# Patient Record
Sex: Male | Born: 1935 | Race: White | Hispanic: No | Marital: Single | Smoking: Former smoker
Health system: Southern US, Community
[De-identification: ages and names within clinical notes are randomized; demographics above are authoritative.]

## PROBLEM LIST (undated history)

## (undated) DIAGNOSIS — I4891 Unspecified atrial fibrillation: Secondary | ICD-10-CM

## (undated) DIAGNOSIS — I219 Acute myocardial infarction, unspecified: Secondary | ICD-10-CM

---

## 2010-11-26 DIAGNOSIS — Z8249 Family history of ischemic heart disease and other diseases of the circulatory system: Secondary | ICD-10-CM | POA: Insufficient documentation

## 2012-05-05 DIAGNOSIS — I200 Unstable angina: Secondary | ICD-10-CM | POA: Insufficient documentation

## 2012-05-05 DIAGNOSIS — I2510 Atherosclerotic heart disease of native coronary artery without angina pectoris: Secondary | ICD-10-CM | POA: Insufficient documentation

## 2012-06-14 DIAGNOSIS — I2589 Other forms of chronic ischemic heart disease: Secondary | ICD-10-CM | POA: Insufficient documentation

## 2012-06-14 DIAGNOSIS — R0989 Other specified symptoms and signs involving the circulatory and respiratory systems: Secondary | ICD-10-CM | POA: Insufficient documentation

## 2012-06-14 DIAGNOSIS — I4891 Unspecified atrial fibrillation: Secondary | ICD-10-CM | POA: Insufficient documentation

## 2012-06-14 DIAGNOSIS — R0609 Other forms of dyspnea: Secondary | ICD-10-CM | POA: Insufficient documentation

## 2012-12-03 DIAGNOSIS — G459 Transient cerebral ischemic attack, unspecified: Secondary | ICD-10-CM | POA: Insufficient documentation

## 2012-12-03 DIAGNOSIS — Z955 Presence of coronary angioplasty implant and graft: Secondary | ICD-10-CM | POA: Insufficient documentation

## 2017-10-19 ENCOUNTER — Inpatient Hospital Stay
Admission: EM | Admit: 2017-10-19 | Discharge: 2017-10-25 | DRG: 872 | Disposition: A | Discharge status: Skilled Nursing Facility | Payer: Medicare HMO | Attending: Internal Medicine | Admitting: Internal Medicine

## 2017-10-19 ENCOUNTER — Emergency Department: Payer: Medicare HMO

## 2017-10-19 ENCOUNTER — Other Ambulatory Visit: Payer: Self-pay

## 2017-10-19 DIAGNOSIS — R6520 Severe sepsis without septic shock: Secondary | ICD-10-CM | POA: Diagnosis present

## 2017-10-19 DIAGNOSIS — M6282 Rhabdomyolysis: Secondary | ICD-10-CM | POA: Diagnosis present

## 2017-10-19 DIAGNOSIS — I482 Chronic atrial fibrillation: Secondary | ICD-10-CM | POA: Diagnosis present

## 2017-10-19 DIAGNOSIS — I248 Other forms of acute ischemic heart disease: Secondary | ICD-10-CM | POA: Diagnosis present

## 2017-10-19 DIAGNOSIS — J209 Acute bronchitis, unspecified: Secondary | ICD-10-CM | POA: Diagnosis present

## 2017-10-19 DIAGNOSIS — Z9181 History of falling: Secondary | ICD-10-CM

## 2017-10-19 DIAGNOSIS — M79671 Pain in right foot: Secondary | ICD-10-CM | POA: Diagnosis present

## 2017-10-19 DIAGNOSIS — R531 Weakness: Secondary | ICD-10-CM | POA: Diagnosis not present

## 2017-10-19 DIAGNOSIS — K219 Gastro-esophageal reflux disease without esophagitis: Secondary | ICD-10-CM | POA: Diagnosis present

## 2017-10-19 DIAGNOSIS — R0602 Shortness of breath: Secondary | ICD-10-CM

## 2017-10-19 DIAGNOSIS — Z7901 Long term (current) use of anticoagulants: Secondary | ICD-10-CM

## 2017-10-19 DIAGNOSIS — I252 Old myocardial infarction: Secondary | ICD-10-CM

## 2017-10-19 DIAGNOSIS — E876 Hypokalemia: Secondary | ICD-10-CM | POA: Diagnosis not present

## 2017-10-19 DIAGNOSIS — Z8673 Personal history of transient ischemic attack (TIA), and cerebral infarction without residual deficits: Secondary | ICD-10-CM | POA: Diagnosis not present

## 2017-10-19 DIAGNOSIS — M79672 Pain in left foot: Secondary | ICD-10-CM | POA: Diagnosis present

## 2017-10-19 DIAGNOSIS — N400 Enlarged prostate without lower urinary tract symptoms: Secondary | ICD-10-CM | POA: Diagnosis present

## 2017-10-19 DIAGNOSIS — A419 Sepsis, unspecified organism: Secondary | ICD-10-CM | POA: Diagnosis not present

## 2017-10-19 DIAGNOSIS — N390 Urinary tract infection, site not specified: Secondary | ICD-10-CM | POA: Diagnosis present

## 2017-10-19 DIAGNOSIS — D72829 Elevated white blood cell count, unspecified: Secondary | ICD-10-CM

## 2017-10-19 DIAGNOSIS — J849 Interstitial pulmonary disease, unspecified: Secondary | ICD-10-CM | POA: Diagnosis present

## 2017-10-19 DIAGNOSIS — I2510 Atherosclerotic heart disease of native coronary artery without angina pectoris: Secondary | ICD-10-CM | POA: Diagnosis present

## 2017-10-19 DIAGNOSIS — K769 Liver disease, unspecified: Secondary | ICD-10-CM | POA: Diagnosis present

## 2017-10-19 DIAGNOSIS — Z66 Do not resuscitate: Secondary | ICD-10-CM | POA: Diagnosis present

## 2017-10-19 DIAGNOSIS — N179 Acute kidney failure, unspecified: Secondary | ICD-10-CM | POA: Diagnosis present

## 2017-10-19 DIAGNOSIS — I509 Heart failure, unspecified: Secondary | ICD-10-CM

## 2017-10-19 DIAGNOSIS — I10 Essential (primary) hypertension: Secondary | ICD-10-CM | POA: Diagnosis present

## 2017-10-19 DIAGNOSIS — R52 Pain, unspecified: Secondary | ICD-10-CM

## 2017-10-19 HISTORY — DX: Unspecified atrial fibrillation: I48.91

## 2017-10-19 HISTORY — DX: Acute myocardial infarction, unspecified: I21.9

## 2017-10-19 LAB — CBC WITH DIFFERENTIAL/PLATELET
BASOS ABS: 0.1 10*3/uL (ref 0–0.1)
BASOS PCT: 0 %
Eosinophils Absolute: 0 10*3/uL (ref 0–0.7)
Eosinophils Relative: 0 %
HEMATOCRIT: 36.7 % — AB (ref 40.0–52.0)
HEMOGLOBIN: 12.8 g/dL — AB (ref 13.0–18.0)
LYMPHS PCT: 4 %
Lymphs Abs: 0.9 10*3/uL — ABNORMAL LOW (ref 1.0–3.6)
MCH: 33 pg (ref 26.0–34.0)
MCHC: 34.7 g/dL (ref 32.0–36.0)
MCV: 95.1 fL (ref 80.0–100.0)
MONOS PCT: 8 %
Monocytes Absolute: 2 10*3/uL — ABNORMAL HIGH (ref 0.2–1.0)
NEUTROS ABS: 21.5 10*3/uL — AB (ref 1.4–6.5)
NEUTROS PCT: 88 %
Platelets: 173 10*3/uL (ref 150–440)
RBC: 3.86 MIL/uL — ABNORMAL LOW (ref 4.40–5.90)
RDW: 14.5 % (ref 11.5–14.5)
WBC: 24.5 10*3/uL — ABNORMAL HIGH (ref 3.8–10.6)

## 2017-10-19 LAB — URINALYSIS, COMPLETE (UACMP) WITH MICROSCOPIC
BILIRUBIN URINE: NEGATIVE
Glucose, UA: NEGATIVE mg/dL
KETONES UR: NEGATIVE mg/dL
Leukocytes, UA: NEGATIVE
NITRITE: NEGATIVE
PROTEIN: NEGATIVE mg/dL
SQUAMOUS EPITHELIAL / LPF: NONE SEEN (ref 0–5)
Specific Gravity, Urine: 1.009 (ref 1.005–1.030)
pH: 5 (ref 5.0–8.0)

## 2017-10-19 LAB — COMPREHENSIVE METABOLIC PANEL
ALT: 22 U/L (ref 0–44)
AST: 106 U/L — AB (ref 15–41)
Albumin: 3.6 g/dL (ref 3.5–5.0)
Alkaline Phosphatase: 46 U/L (ref 38–126)
Anion gap: 11 (ref 5–15)
BUN: 19 mg/dL (ref 8–23)
CHLORIDE: 102 mmol/L (ref 98–111)
CO2: 25 mmol/L (ref 22–32)
Calcium: 8.9 mg/dL (ref 8.9–10.3)
Creatinine, Ser: 1.56 mg/dL — ABNORMAL HIGH (ref 0.61–1.24)
GFR calc Af Amer: 46 mL/min — ABNORMAL LOW (ref >60)
GFR calc non Af Amer: 40 mL/min — ABNORMAL LOW (ref >60)
Glucose, Bld: 98 mg/dL (ref 70–99)
POTASSIUM: 3.7 mmol/L (ref 3.5–5.1)
SODIUM: 138 mmol/L (ref 135–145)
Total Bilirubin: 1.7 mg/dL — ABNORMAL HIGH (ref 0.3–1.2)
Total Protein: 7.2 g/dL (ref 6.5–8.1)

## 2017-10-19 LAB — PROTIME-INR
INR: 1.41
PROTHROMBIN TIME: 17.1 s — AB (ref 11.4–15.2)

## 2017-10-19 LAB — TSH: TSH: 1.283 u[IU]/mL (ref 0.350–4.500)

## 2017-10-19 LAB — LIPASE, BLOOD: Lipase: 24 U/L (ref 11–51)

## 2017-10-19 LAB — TROPONIN I
TROPONIN I: 0.04 ng/mL — AB (ref <0.03)
TROPONIN I: 0.05 ng/mL — AB (ref <0.03)

## 2017-10-19 LAB — LACTIC ACID, PLASMA
LACTIC ACID, VENOUS: 1.6 mmol/L (ref 0.5–1.9)
LACTIC ACID, VENOUS: 2 mmol/L — AB (ref 0.5–1.9)

## 2017-10-19 LAB — APTT: aPTT: 26 seconds (ref 24–36)

## 2017-10-19 MED ORDER — VANCOMYCIN HCL 10 G IV SOLR
1250.0000 mg | INTRAVENOUS | Status: DC
  Administered 2017-10-19: 1250 mg via INTRAVENOUS
  Filled 2017-10-19 (×2): qty 1250

## 2017-10-19 MED ORDER — SODIUM CHLORIDE 0.9 % IV SOLN
2.0000 g | Freq: Two times a day (BID) | INTRAVENOUS | Status: DC
  Administered 2017-10-20 – 2017-10-22 (×4): 2 g via INTRAVENOUS
  Filled 2017-10-19 (×6): qty 2

## 2017-10-19 MED ORDER — TAMSULOSIN HCL 0.4 MG PO CAPS
0.4000 mg | ORAL_CAPSULE | Freq: Two times a day (BID) | ORAL | Status: DC
  Administered 2017-10-19 – 2017-10-25 (×12): 0.4 mg via ORAL
  Filled 2017-10-19 (×13): qty 1

## 2017-10-19 MED ORDER — ONDANSETRON HCL 4 MG/2ML IJ SOLN: 4.0000 mg | Freq: Four times a day (QID) | INTRAMUSCULAR | Status: DC | PRN

## 2017-10-19 MED ORDER — WARFARIN 1.25 MG HALF TABLET: 1.2500 mg | ORAL_TABLET | Freq: Every day | ORAL | Status: DC

## 2017-10-19 MED ORDER — ONDANSETRON HCL 4 MG PO TABS: 4.0000 mg | ORAL_TABLET | Freq: Four times a day (QID) | ORAL | Status: DC | PRN

## 2017-10-19 MED ORDER — WARFARIN - PHARMACIST DOSING INPATIENT
Freq: Every day | Status: DC
  Administered 2017-10-21: 17:00:00
  Filled 2017-10-19: qty 1

## 2017-10-19 MED ORDER — PANTOPRAZOLE SODIUM 40 MG PO TBEC
40.0000 mg | DELAYED_RELEASE_TABLET | Freq: Every day | ORAL | Status: DC
  Administered 2017-10-19 – 2017-10-25 (×7): 40 mg via ORAL
  Filled 2017-10-19 (×7): qty 1

## 2017-10-19 MED ORDER — WARFARIN SODIUM 2 MG PO TABS
2.0000 mg | ORAL_TABLET | Freq: Once | ORAL | Status: AC
  Administered 2017-10-19: 23:00:00 2 mg via ORAL
  Filled 2017-10-19: qty 1

## 2017-10-19 MED ORDER — VANCOMYCIN HCL IN DEXTROSE 1-5 GM/200ML-% IV SOLN
1000.0000 mg | Freq: Once | INTRAVENOUS | Status: AC
  Administered 2017-10-19: 1000 mg via INTRAVENOUS
  Filled 2017-10-19: qty 200

## 2017-10-19 MED ORDER — ISOSORBIDE MONONITRATE ER 30 MG PO TB24
30.0000 mg | ORAL_TABLET | Freq: Every day | ORAL | Status: DC
  Administered 2017-10-20 – 2017-10-25 (×6): 30 mg via ORAL
  Filled 2017-10-19 (×6): qty 1

## 2017-10-19 MED ORDER — ENOXAPARIN SODIUM 40 MG/0.4ML ~~LOC~~ SOLN
40.0000 mg | SUBCUTANEOUS | Status: DC
  Administered 2017-10-19 – 2017-10-24 (×6): 40 mg via SUBCUTANEOUS
  Filled 2017-10-19 (×6): qty 0.4

## 2017-10-19 MED ORDER — VERAPAMIL HCL 80 MG PO TABS
80.0000 mg | ORAL_TABLET | Freq: Two times a day (BID) | ORAL | Status: DC
  Administered 2017-10-19 – 2017-10-25 (×12): 80 mg via ORAL
  Filled 2017-10-19 (×13): qty 1

## 2017-10-19 MED ORDER — SODIUM CHLORIDE 0.9 % IV SOLN
2.0000 g | Freq: Once | INTRAVENOUS | Status: AC
  Administered 2017-10-19: 2 g via INTRAVENOUS
  Filled 2017-10-19: qty 2

## 2017-10-19 MED ORDER — NITROGLYCERIN 0.4 MG SL SUBL: 0.4000 mg | SUBLINGUAL_TABLET | SUBLINGUAL | Status: DC | PRN

## 2017-10-19 MED ORDER — SODIUM CHLORIDE 0.9 % IV BOLUS (SEPSIS)
1000.0000 mL | Freq: Once | INTRAVENOUS | Status: AC
  Administered 2017-10-19: 1000 mL via INTRAVENOUS

## 2017-10-19 MED ORDER — ACETAMINOPHEN 325 MG PO TABS
325.0000 mg | ORAL_TABLET | Freq: Four times a day (QID) | ORAL | Status: DC | PRN
  Administered 2017-10-21 (×2): 325 mg via ORAL
  Filled 2017-10-19 (×2): qty 1

## 2017-10-19 MED ORDER — SODIUM CHLORIDE 0.9 % IV SOLN
INTRAVENOUS | Status: DC
  Administered 2017-10-19 – 2017-10-20 (×2): via INTRAVENOUS

## 2017-10-19 MED ORDER — METRONIDAZOLE IN NACL 5-0.79 MG/ML-% IV SOLN
500.0000 mg | Freq: Three times a day (TID) | INTRAVENOUS | Status: DC
  Administered 2017-10-19 – 2017-10-20 (×3): 500 mg via INTRAVENOUS
  Filled 2017-10-19 (×5): qty 100

## 2017-10-19 MED ORDER — DOCUSATE SODIUM 100 MG PO CAPS
100.0000 mg | ORAL_CAPSULE | Freq: Two times a day (BID) | ORAL | Status: DC
  Administered 2017-10-19 – 2017-10-25 (×12): 100 mg via ORAL
  Filled 2017-10-19 (×12): qty 1

## 2017-10-19 MED ORDER — ATORVASTATIN CALCIUM 20 MG PO TABS
40.0000 mg | ORAL_TABLET | Freq: Every day | ORAL | Status: DC
  Administered 2017-10-20 – 2017-10-24 (×4): 40 mg via ORAL
  Filled 2017-10-19 (×5): qty 2

## 2017-10-19 NOTE — H&P (Signed)
[facility] NAME: Jeremiah James    MR#:  [id number]  DATE OF BIRTH:  November 10, 1933  DATE OF ADMISSION:  10/19/2017  PRIMARY CARE PHYSICIAN: [name], MD   REQUESTING/REFERRING PHYSICIAN: [name]  CHIEF COMPLAINT:   Generalized weakness HISTORY OF PRESENT ILLNESS:  Jeremiah James  is a 84 y.o. male with a known history of chronic atrial fibrillation on Coumadin, coronary artery disease status post MI, history of TIA with generalized weakness is presenting to the ED with worsening of weakness.  Today patient is unable to get out of the chair and he fell because he was too weak to walk.  Patient came into the ED.  Creatinine is at 1.52 and WBC count is elevated.  Patient is tachycardic sepsis protocol implemented cultures were done and patient is started on empiric IV antibiotics hospitalist team is called to admit the patient.  PAST MEDICAL HISTORY:   Past Medical History:  Diagnosis Date  . A-fib ([place])   . MI (myocardial infarction) ([place])    x6    PAST SURGICAL HISTOIRY:   Denies any surgeries SOCIAL HISTORY:   Social History   Tobacco Use  . Smoking status: Not on file  Substance Use Topics  . Alcohol use: Not on file    FAMILY HISTORY:  No family history on file.  DRUG ALLERGIES:   Allergies  Allergen Reactions  . Morphine Rash    unknown  . Hydrocodone-Acetaminophen Rash    agitation    REVIEW OF SYSTEMS:  CONSTITUTIONAL: No fever, fatigue.  Patient is reporting generalized weakness.  EYES: No blurred or double vision.  EARS, NOSE, AND THROAT: No tinnitus or ear pain.  RESPIRATORY: No cough, shortness of breath, wheezing or hemoptysis.  CARDIOVASCULAR: No chest pain, orthopnea, edema.  GASTROINTESTINAL: No nausea, vomiting, diarrhea or abdominal pain.  GENITOURINARY: No dysuria, hematuria.  ENDOCRINE: No polyuria, nocturia,  HEMATOLOGY: No anemia, easy bruising or bleeding SKIN: No rash or  lesion. MUSCULOSKELETAL: No joint pain or arthritis.   NEUROLOGIC: No tingling, numbness, weakness.  PSYCHIATRY: No anxiety or depression.   MEDICATIONS AT HOME:   Prior to Admission medications   Medication Sig Start Date End Date Taking? Authorizing Provider  allopurinol (ZYLOPRIM) 100 MG tablet Take 1 tablet by mouth daily.   Yes [provider]  atorvastatin (LIPITOR) 40 MG tablet Take 40 mg by mouth daily.   Yes [provider]  hydrochlorothiazide (HYDRODIURIL) 25 MG tablet Take 1 tablet by mouth daily. 11/10/11  Yes [provider]  isosorbide mononitrate (IMDUR) 30 MG 24 hr tablet Take 1 tablet by mouth daily. 08/17/12  Yes [provider]  nitroGLYCERIN (NITROSTAT) 0.4 MG SL tablet Place 0.4 mg under the tongue every 5 (five) minutes as needed for chest pain.   Yes [provider]  pantoprazole (PROTONIX) 40 MG tablet Take 1 tablet by mouth daily. 03/21/12  Yes [provider]  potassium chloride SA (K-DUR,KLOR-CON) 20 MEQ tablet Take 20 mEq by mouth 2 (two) times daily.   Yes [provider]  tamsulosin (FLOMAX) 0.4 MG CAPS capsule Take 1 capsule by mouth 2 (two) times daily.   Yes [provider]  verapamil (CALAN) 80 MG tablet Take 80 mg by mouth 2 (two) times daily.   Yes [provider]  warfarin (COUMADIN) 2.5 MG tablet Take 1.25 mg by mouth at bedtime.   Yes [provider]  acetaminophen (TYLENOL) 325 MG tablet Take 1  tablet by mouth 3 (three) times daily as needed.    [provider]      VITAL SIGNS:  Blood pressure (!) 141/67, pulse 93, temperature 99.9 F (37.7 C), temperature source Oral, resp. rate 18, height 6' 8"  (2.032 m), weight 82 kg, SpO2 96 %.  PHYSICAL EXAMINATION:  GENERAL:  84 y.o.-year-old patient lying in the bed with no acute distress.  EYES: Pupils equal, round, reactive to light and accommodation. No scleral icterus. Extraocular muscles intact.  HEENT:  Head atraumatic, normocephalic. Oropharynx and nasopharynx clear.  NECK:  Supple, no jugular venous distention. No thyroid enlargement, no tenderness.  LUNGS: Normal breath sounds bilaterally, no wheezing, rales,rhonchi or crepitation. No use of accessory muscles of respiration.  CARDIOVASCULAR: Regularly regular no murmurs, rubs, or gallops.  ABDOMEN: Soft, nontender, nondistended. Bowel sounds present. EXTREMITIES: No pedal edema, cyanosis, or clubbing.  NEUROLOGIC: Cranial nerves II through XII are intact.  Sensation intact. Gait not checked.  PSYCHIATRIC: The patient is alert and oriented x 3.  SKIN: No obvious rash, lesion, or ulcer.   LABORATORY PANEL:   CBC Recent Labs  Lab 10/19/17 1537  WBC 24.5*  HGB 12.8*  HCT 36.7*  PLT 173   ------------------------------------------------------------------------------------------------------------------  Chemistries  Recent Labs  Lab 10/19/17 1537  NA 138  K 3.7  CL 102  CO2 25  GLUCOSE 98  BUN 19  CREATININE 1.56*  CALCIUM 8.9  AST 106*  ALT 22  ALKPHOS 46  BILITOT 1.7*   ------------------------------------------------------------------------------------------------------------------  Cardiac Enzymes Recent Labs  Lab 10/19/17 1537  TROPONINI 0.04*   ------------------------------------------------------------------------------------------------------------------  RADIOLOGY:  Ct Abdomen Pelvis Wo Contrast  Result Date: 10/19/2017 CLINICAL DATA:  84 year old male with history of worsening weakness for the past 3-4 days. Nausea and vomiting. EXAM: CT CHEST, ABDOMEN AND PELVIS WITHOUT CONTRAST TECHNIQUE: Multidetector CT imaging of the chest, abdomen and pelvis was performed following the standard protocol without IV contrast. COMPARISON:  None. FINDINGS: CT CHEST FINDINGS Cardiovascular: Heart size is normal. There is no significant pericardial fluid, thickening or pericardial calcification. There is aortic  atherosclerosis, as well as atherosclerosis of the great vessels of the mediastinum and the coronary arteries, including calcified atherosclerotic plaque in the left main, left anterior descending, left circumflex and right coronary arteries. Mediastinum/Nodes: No pathologically enlarged mediastinal or hilar lymph nodes. Please note that accurate exclusion of hilar adenopathy is limited on noncontrast CT scans. Esophagus is unremarkable in appearance. No axillary lymphadenopathy. Lungs/Pleura: Patchy areas of peripheral predominant ground-glass attenuation and septal thickening are noted throughout the mid to lower lungs bilaterally. Throughout the same distribution there is mild cylindrical bronchiectasis. No honeycombing. No acute consolidative airspace disease. No pleural effusions. No suspicious appearing pulmonary nodules or masses are noted. Musculoskeletal: There are no aggressive appearing lytic or blastic lesions noted in the visualized portions of the skeleton. CT ABDOMEN PELVIS FINDINGS Hepatobiliary: Multiple low-attenuation lesions are noted in the liver, incompletely characterized on today's noncontrast CT examination, but favored to represent cysts, largest of which measures 2.1 x 1.4 cm in segment 2 of the liver. Status post cholecystectomy. Pancreas: No definite pancreatic mass or peripancreatic fluid or inflammatory changes are noted on today's noncontrast CT examination. Spleen: Unremarkable. Adrenals/Urinary Tract: 2 mm nonobstructive calculus in the upper pole collecting system of the right kidney. No additional calculi are noted within the collecting system of either kidney, along the course of either ureter, or within the lumen of the urinary bladder. No hydroureteronephrosis. Mild atrophy of both kidneys. No definite suspicious  renal lesions identified on today's noncontrast CT examination. Unenhanced appearance of the urinary bladder is normal. Bilateral adrenal glands are normal in  appearance. Stomach/Bowel: Unenhanced appearance of the stomach is normal. Small duodenal diverticulum extending off the medial aspect of the second portion of the duodenum. No surrounding inflammatory changes. No pathologic dilatation of small bowel or colon. Numerous colonic diverticulae are noted, most evident in the sigmoid colon, without surrounding inflammatory changes to suggest an acute diverticulitis at this time. Normal appendix. Vascular/Lymphatic: Aortic atherosclerosis. No lymphadenopathy noted in the abdomen or pelvis. Reproductive: Prostate gland and seminal vesicles are unremarkable in appearance. Other: No significant volume of ascites.  No pneumoperitoneum. Musculoskeletal: There are no aggressive appearing lytic or blastic lesions noted in the visualized portions of the skeleton. IMPRESSION: 1. No acute findings are noted in the abdomen or pelvis to account for the patient's symptoms. 2. Colonic diverticulosis without evidence of acute diverticulitis at this time. 3. 2 mm nonobstructive calculus in the upper pole collecting system of the right kidney. 4. Multiple low-attenuation lesions in the liver, incompletely characterized on today's noncontrast CT examination, but favored to represent cysts. These could be definitively characterized with nonemergent MRI of the abdomen with and without IV gadolinium if there is any clinical concern for metastatic disease to the liver. 5. Aortic atherosclerosis, in addition to left main and 3 vessel coronary artery disease. 6. Findings in the lungs concerning for probable interstitial lung disease. Outpatient referral to Pulmonology for further evaluation is recommended. Additionally, high-resolution chest CT in 12 months is suggested to assess for temporal changes in the appearance of the lung parenchyma. Aortic Atherosclerosis (ICD10-I70.0). Electronically Signed   By: Vinnie Langton M.D.   On: 10/19/2017 17:06   Ct Chest Wo Contrast  Result Date:  10/19/2017 CLINICAL DATA:  83 year old male with history of worsening weakness for the past 3-4 days. Nausea and vomiting. EXAM: CT CHEST, ABDOMEN AND PELVIS WITHOUT CONTRAST TECHNIQUE: Multidetector CT imaging of the chest, abdomen and pelvis was performed following the standard protocol without IV contrast. COMPARISON:  None. FINDINGS: CT CHEST FINDINGS Cardiovascular: Heart size is normal. There is no significant pericardial fluid, thickening or pericardial calcification. There is aortic atherosclerosis, as well as atherosclerosis of the great vessels of the mediastinum and the coronary arteries, including calcified atherosclerotic plaque in the left main, left anterior descending, left circumflex and right coronary arteries. Mediastinum/Nodes: No pathologically enlarged mediastinal or hilar lymph nodes. Please note that accurate exclusion of hilar adenopathy is limited on noncontrast CT scans. Esophagus is unremarkable in appearance. No axillary lymphadenopathy. Lungs/Pleura: Patchy areas of peripheral predominant ground-glass attenuation and septal thickening are noted throughout the mid to lower lungs bilaterally. Throughout the same distribution there is mild cylindrical bronchiectasis. No honeycombing. No acute consolidative airspace disease. No pleural effusions. No suspicious appearing pulmonary nodules or masses are noted. Musculoskeletal: There are no aggressive appearing lytic or blastic lesions noted in the visualized portions of the skeleton. CT ABDOMEN PELVIS FINDINGS Hepatobiliary: Multiple low-attenuation lesions are noted in the liver, incompletely characterized on today's noncontrast CT examination, but favored to represent cysts, largest of which measures 2.1 x 1.4 cm in segment 2 of the liver. Status post cholecystectomy. Pancreas: No definite pancreatic mass or peripancreatic fluid or inflammatory changes are noted on today's noncontrast CT examination. Spleen: Unremarkable. Adrenals/Urinary  Tract: 2 mm nonobstructive calculus in the upper pole collecting system of the right kidney. No additional calculi are noted within the collecting system of either kidney, along the course  of either ureter, or within the lumen of the urinary bladder. No hydroureteronephrosis. Mild atrophy of both kidneys. No definite suspicious renal lesions identified on today's noncontrast CT examination. Unenhanced appearance of the urinary bladder is normal. Bilateral adrenal glands are normal in appearance. Stomach/Bowel: Unenhanced appearance of the stomach is normal. Small duodenal diverticulum extending off the medial aspect of the second portion of the duodenum. No surrounding inflammatory changes. No pathologic dilatation of small bowel or colon. Numerous colonic diverticulae are noted, most evident in the sigmoid colon, without surrounding inflammatory changes to suggest an acute diverticulitis at this time. Normal appendix. Vascular/Lymphatic: Aortic atherosclerosis. No lymphadenopathy noted in the abdomen or pelvis. Reproductive: Prostate gland and seminal vesicles are unremarkable in appearance. Other: No significant volume of ascites.  No pneumoperitoneum. Musculoskeletal: There are no aggressive appearing lytic or blastic lesions noted in the visualized portions of the skeleton. IMPRESSION: 1. No acute findings are noted in the abdomen or pelvis to account for the patient's symptoms. 2. Colonic diverticulosis without evidence of acute diverticulitis at this time. 3. 2 mm nonobstructive calculus in the upper pole collecting system of the right kidney. 4. Multiple low-attenuation lesions in the liver, incompletely characterized on today's noncontrast CT examination, but favored to represent cysts. These could be definitively characterized with nonemergent MRI of the abdomen with and without IV gadolinium if there is any clinical concern for metastatic disease to the liver. 5. Aortic atherosclerosis, in addition to left  main and 3 vessel coronary artery disease. 6. Findings in the lungs concerning for probable interstitial lung disease. Outpatient referral to Pulmonology for further evaluation is recommended. Additionally, high-resolution chest CT in 12 months is suggested to assess for temporal changes in the appearance of the lung parenchyma. Aortic Atherosclerosis (ICD10-I70.0). Electronically Signed   By: Vinnie Langton M.D.   On: 10/19/2017 17:06   Dg Chest Port 1 View  Result Date: 10/19/2017 CLINICAL DATA:  Weakness. EXAM: PORTABLE CHEST 1 VIEW COMPARISON:  None. FINDINGS: The heart size and mediastinal contours are within normal limits. Both lungs are clear. No pneumothorax or pleural effusion is noted. The visualized skeletal structures are unremarkable. IMPRESSION: No acute cardiopulmonary abnormality seen. Electronically Signed   By: Marijo Conception, M.D.   On: 10/19/2017 15:43    EKG:   Orders placed or performed during the hospital encounter of 10/19/17  . EKG 12-Lead  . EKG 12-Lead    IMPRESSION AND PLAN:     #Sepsis unclear etiology Admit to MedSurg unit Patient met septic criteria with tachycardia and leukocytosis at the time of admission Hydrate with IV fluids and provide empiric IV antibiotics cefepime vancomycin and Flagyl. Follow-up on the blood cultures and urine culture and sensitivity CT abdomen and pelvis with no acute findings Follow-up on the lactic acid level and procalcitonin  #AKI Hydrate with IV fluids and avoid nephrotoxins Hold hydrochlorothiazide and allopurinol   # atrial fibrillation with RVR Gentle hydration with IV fluids and resume his home medication Patient is on Coumadin, Coumadin management by pharmacy INR is subtherapeutic at this time  #Coronary artery disease continue Imdur, statin   #Liver lesions patient is to get outpatient MRI PCP to follow-up  #Interstitial lung disease outpatient pulmonology follow-up    All the records are reviewed  and case discussed with ED provider. Management plans discussed with the patient, family and they are in agreement.  CODE STATUS: fc   TOTAL TIME TAKING CARE OF THIS PATIENT: 43 minutes.   Note: This dictation was  prepared with Dragon dictation along with smaller phrase technology. Any transcriptional errors that result from this process are unintentional.  Nicholes Mango M.D on 10/19/2017 at 8:40 PM  Between 7am to 6pm - Pager - [phone]  After 6pm go to [email or web address] - password [email or web address]  [facility]  Office  [phone]  CC: Primary care physician; [name], MD

## 2017-10-19 NOTE — ED Notes (Signed)
IN and OUT cath completed. Upon removal of cath pt had small amount of bright blood from tip of penis. Looked like signs of trama. Pt talked about past cath experience where the process was painful and rough.  Blood discontinued after wiping it.     Pt was given sandwich tray and drink Awaiting bed assignment   lmedt

## 2017-10-19 NOTE — ED Notes (Signed)
Pt taken to floor via stretcher. VSS. NAD. Report called to April all questions and concerns addressed.

## 2017-10-19 NOTE — ED Triage Notes (Signed)
Pt comes into the ED via EMS from home with c/o increased weakness over the past 3-4 days, states yesterday he had N/V, states last night he got so weak he fell and is c/o BL hip pain, called EMS this morning to help him up in to his chair and since has slid  twice out of chair and had to call ems again . Pt is a/ox4 on arrival.

## 2017-10-19 NOTE — Progress Notes (Signed)
CODE SEPSIS - PHARMACY COMMUNICATION  **Broad Spectrum Antibiotics should be administered within 1 hour of Sepsis diagnosis**  Time Code Sepsis Called/Page Received: @ 1610  Antibiotics Ordered: Cefepime                                      Vancomycin   Time of 1st antibiotic administration: @ 1657  Additional action taken by pharmacy: Spoke to nurse @  1650 about starting Abx   Gardner CandleSheema M Adana Marik, PharmD, BCPS Clinical Pharmacist 10/19/2017 5:06 PM

## 2017-10-19 NOTE — ED Notes (Signed)
Kelley RN, aware of bed assigned  

## 2017-10-19 NOTE — Consult Note (Signed)
Pharmacy Antibiotic Note  Jeremiah James is a 84 y.o. male admitted on 10/19/2017 with sepsis.  Pharmacy has been consulted for Cefepime and vancomycin  Dosing. Patient received vancomycin 1g IV x 1 dose and cefepime 2g IV x 1 dose in ED.   Plan: Ke: 0.039   T1/2: 17.8  Vd: 57.4  Start Vancomycin 1250 IV every 24 hours with 6 hour stack dosing.  Goal trough 15-20 mcg/mL. Calculated trough @ Css is 17. Trough level ordered prior to 4th dose.  Start Cefepime 2g IV every 12 hours.   Height: 6\' 8"  (203.2 cm) Weight: 180 lb 12.8 oz (82 kg) IBW/kg (Calculated) : 96  Temp (24hrs), Avg:99.9 F (37.7 C), Min:99.9 F (37.7 C), Max:99.9 F (37.7 C)  Recent Labs  Lab 10/19/17 1537  WBC 24.5*  CREATININE 1.56*  LATICACIDVEN 1.6    Estimated Creatinine Clearance: 42.3 mL/min (A) (by C-G formula based on SCr of 1.56 mg/dL (H)).    Allergies  Allergen Reactions  . Morphine Rash    unknown  . Hydrocodone-Acetaminophen Rash    agitation    Antimicrobials this admission: 9/24 Vancomycin  >>  9/24 Cefepime >>  9/24 Flagyl>>  Microbiology results: 9/24  BCx: pending 9/24  UCx: pending  Thank you for allowing pharmacy to be a part of this patient's care.  Gardner CandleSheema M Mearle Drew, PharmD, BCPS Clinical Pharmacist 10/19/2017 7:42 PM

## 2017-10-19 NOTE — ED Provider Notes (Signed)
[facility] Emergency Department Provider Note  ____________________________________________  Time seen: Approximately 5:11 PM  I have reviewed the triage vital signs and the nursing notes.   HISTORY  Chief Complaint Weakness  Level 5 Caveat: Portions of the History and Physical including HPI and review of systems are unable to be completely obtained due to patient being a poor historian    HPI Jeremiah James is a 84 y.o. male with a history of atrial fibrillation and CAD status post MI and TIA who complains of generalized weakness over the past 4 days.  Yesterday he got so weak that he slid out of a chair and was unable to get up off the floor.  He had to call EMS to help him get up.  Complains of bilateral hip pain after the fall.  Today he fell again because is too weak to walk.  Never had anything like this before.  Reports decreased appetite over the last few days.  No headache vision change paresthesias or weakness.  No significant trauma.  Denies cough or chest pain or belly pain.      Past Medical History:  Diagnosis Date  . A-fib (HCC)   . MI (myocardial infarction) [facility])    x6     Patient Active Problem List   Diagnosis Date Noted  . Sepsis (HCC) 10/19/2017  . Transient ischemic attack 12/03/2012  . S/P coronary artery stent placement 12/03/2012  . Atrial fibrillation (HCC) 06/14/2012  . 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 Other dyspnea and respiratory abnormality 06/14/2012  . Coronary artery disease involving native coronary artery of native heart without angina pectoris 05/05/2012  . Intermediate coronary syndrome (HCC) 05/05/2012  . Family history of ischemic heart disease (IHD) 11/26/2010        Prior to Admission medications   Medication Sig Start Date End Date Taking? Authorizing Provider  allopurinol (ZYLOPRIM) 100 MG tablet Take 1 tablet by mouth daily.   Yes [provider]   atorvastatin (LIPITOR) 40 MG tablet Take 40 mg by mouth daily.   Yes [provider]  hydrochlorothiazide (HYDRODIURIL) 25 MG tablet Take 1 tablet by mouth daily. 11/10/11  Yes [provider]  isosorbide mononitrate (IMDUR) 30 MG 24 hr tablet Take 1 tablet by mouth daily. 08/17/12  Yes [provider]  nitroGLYCERIN (NITROSTAT) 0.4 MG SL tablet Place 0.4 mg under the tongue every 5 (five) minutes as needed for chest pain.   Yes [provider]  pantoprazole (PROTONIX) 40 MG tablet Take 1 tablet by mouth daily. 03/21/12  Yes [provider]  potassium chloride SA (K-DUR,KLOR-CON) 20 MEQ tablet Take 20 mEq by mouth 2 (two) times daily.   Yes [provider]  tamsulosin (FLOMAX) 0.4 MG CAPS capsule Take 1 capsule by mouth 2 (two) times daily.   Yes [provider]  verapamil (CALAN) 80 MG tablet Take 80 mg by mouth 2 (two) times daily.   Yes [provider]  warfarin (COUMADIN) 2.5 MG tablet Take 1.25 mg by mouth at bedtime.   Yes [provider]  acetaminophen (TYLENOL) 325 MG tablet Take 1 tablet by mouth 3 (three) times daily as needed.    [provider]   KLOR-CON M20 20 MEQ tablet  TAKE 1 TABLET EVERY DAY 90 tablet  2 05/19/2011  Active  hydrochlorothiazide (HYDRODIURIL) 25 MG tablet  Take 1 tablet by mouth daily.  0 01/30/2012  Active  warfarin (COUMADIN) 2.5 MG tablet  TAKE 1 TABLET BY MOUTH EVERY DAY 90 tablet  3 04/28/2012  Active  NITROSTAT 0.4 MG SL tablet  Take 1 tablet by mouth every 5 (five) minutes as needed.  0 05/05/2012  Active  verapamil (CALAN) 120 MG tablet  Take 120 mg by mouth 2 (two) times daily.  0   Active  isosorbide mononitrate (IMDUR) 30 MG 24 hr tablet  TAKE 1 TABLET BY MOUTH EVERY DAY 30 tablet  6 08/17/2012  Active  simvastatin (ZOCOR) 40 MG tablet  TAKE 1 TABLET (40 MG TOTAL) BY MOUTH AT BEDTIME. 90 tablet  3 09/05/2012  Active  NEXIUM 40 MG capsule  TAKE  ONE CAPSULE BY MOUTH EVERY DAY 90 capsule  3 10/17/2012  Active      Allergies Morphine and Hydrocodone-acetaminophen   No family history on file.  Social History Social History   Tobacco Use  . Smoking status: Not on file  Substance Use Topics  . Alcohol use: Not on file  . Drug use: Not on file  No tobacco or alcohol use  Review of Systems  Constitutional:   No fever or chills.  ENT:   No sore throat. No rhinorrhea. Cardiovascular:   No chest pain or syncope. Respiratory:   No dyspnea or cough. Gastrointestinal:   Negative for abdominal pain, vomiting and diarrhea.  Musculoskeletal:   Negative for focal pain or swelling All other systems reviewed and are negative except as documented above in ROS and HPI.  ____________________________________________   PHYSICAL EXAM:  VITAL SIGNS: ED Triage Vitals  Enc Vitals Group     BP 10/19/17 1502 140/75     Pulse Rate 10/19/17 1502 (!) 101     Resp 10/19/17 1502 20     Temp 10/19/17 1502 99.9 F (37.7 C)     Temp Source 10/19/17 1502 Oral     SpO2 10/19/17 1502 94 %     Weight 10/19/17 1504 180 lb 12.8 oz (82 kg)     Height 10/19/17 1504 6\' 8"  (2.032 m)     Head Circumference --      Peak Flow --      Pain Score 10/19/17 1503 5     Pain Loc --      Pain Edu? --      Excl. in GC? --     Vital signs reviewed, nursing assessments reviewed.   Constitutional:   Alert and oriented.  Ill-appearing. Eyes:   Conjunctivae are normal. EOMI. PERRL. ENT      Head:   Normocephalic and atraumatic.      Nose:   No congestion/rhinnorhea.       Mouth/Throat:   Dry mucous membranes, no pharyngeal erythema. No peritonsillar mass.       Neck:   No meningismus. Full ROM. Hematological/Lymphatic/Immunilogical:   No cervical lymphadenopathy. Cardiovascular:   Irregularly irregular rhythm, rate controlled at 100-110. Symmetric bilateral radial and DP pulses.  No murmurs. Cap refill less than 2 seconds. Respiratory:   Normal  respiratory effort without tachypnea/retractions. Breath sounds are clear and equal bilaterally. No wheezes/rales/rhonchi. Gastrointestinal:   Soft and nontender. Non distended. There is no CVA tenderness.  No rebound, rigidity, or guarding. Musculoskeletal:   Normal range of motion in all extremities. No joint effusions.  No lower extremity tenderness.  No edema. Neurologic:   Normal speech and language.  Motor grossly intact. No acute focal neurologic deficits are appreciated.  Skin:    Skin is warm, dry and intact. No rash noted.  No petechiae, purpura, or bullae.  ____________________________________________    LABS (pertinent positives/negatives) (all labs ordered are listed, but only abnormal results are displayed) Labs Reviewed  COMPREHENSIVE METABOLIC PANEL - Abnormal; Notable for the following components:      Result Value   Creatinine, Ser 1.56 (*)    AST 106 (*)    Total Bilirubin 1.7 (*)    GFR calc non Af Amer 40 (*)    GFR calc Af Amer 46 (*)    All other components within normal limits  TROPONIN I - Abnormal; Notable for the following components:   Troponin I 0.04 (*)    All other components within normal limits  CBC WITH DIFFERENTIAL/PLATELET - Abnormal; Notable for the following components:   WBC 24.5 (*)    RBC 3.86 (*)    Hemoglobin 12.8 (*)    HCT 36.7 (*)    Neutro Abs 21.5 (*)    Lymphs Abs 0.9 (*)    Monocytes Absolute 2.0 (*)    All other components within normal limits  PROTIME-INR - Abnormal; Notable for the following components:   Prothrombin Time 17.1 (*)    All other components within normal limits  URINALYSIS, COMPLETE (UACMP) WITH MICROSCOPIC - Abnormal; Notable for the following components:   Color, Urine YELLOW (*)    APPearance CLOUDY (*)    Hgb urine dipstick LARGE (*)    Bacteria, UA RARE (*)    All other components within normal limits  CULTURE, BLOOD (ROUTINE X 2)  CULTURE, BLOOD (ROUTINE X 2)  URINE CULTURE  LACTIC ACID, PLASMA   LIPASE, BLOOD  APTT  TSH  LACTIC ACID, PLASMA   ____________________________________________   EKG  Interpreted by me Atrial fibrillation rate 124, normal axis and intervals.  Normal QRS ST segments and T waves.  5 PVCs on the strip.  ____________________________________________    RADIOLOGY  Ct Abdomen Pelvis Wo Contrast  Result Date: 10/19/2017 CLINICAL DATA:  84 year old male with history of worsening weakness for the past 3-4 days. Nausea and vomiting. EXAM: CT CHEST, ABDOMEN AND PELVIS WITHOUT CONTRAST TECHNIQUE: Multidetector CT imaging of the chest, abdomen and pelvis was performed following the standard protocol without IV contrast. COMPARISON:  None. FINDINGS: CT CHEST FINDINGS Cardiovascular: Heart size is normal. There is no significant pericardial fluid, thickening or pericardial calcification. There is aortic atherosclerosis, as well as atherosclerosis of the great vessels of the mediastinum and the coronary arteries, including calcified atherosclerotic plaque in the left main, left anterior descending, left circumflex and right coronary arteries. Mediastinum/Nodes: No pathologically enlarged mediastinal or hilar lymph nodes. Please note that accurate exclusion of hilar adenopathy is limited on noncontrast CT scans. Esophagus is unremarkable in appearance. No axillary lymphadenopathy. Lungs/Pleura: Patchy areas of peripheral predominant ground-glass attenuation and septal thickening are noted throughout the mid to lower lungs bilaterally. Throughout the same distribution there is mild cylindrical bronchiectasis. No honeycombing. No acute consolidative airspace disease. No pleural effusions. No suspicious appearing pulmonary nodules or masses are noted. Musculoskeletal: There are no aggressive appearing lytic or blastic lesions noted in the visualized portions of the skeleton. CT ABDOMEN PELVIS FINDINGS Hepatobiliary: Multiple low-attenuation lesions are noted in the liver,  incompletely characterized on today's noncontrast CT examination, but favored to represent cysts, largest of which measures 2.1 x 1.4 cm in segment 2 of the liver. Status post cholecystectomy. Pancreas: No definite pancreatic mass or peripancreatic fluid or inflammatory changes are noted on today's noncontrast CT examination. Spleen: Unremarkable. Adrenals/Urinary Tract: 2 mm nonobstructive  calculus in the upper pole collecting system of the right kidney. No additional calculi are noted within the collecting system of either kidney, along the course of either ureter, or within the lumen of the urinary bladder. No hydroureteronephrosis. Mild atrophy of both kidneys. No definite suspicious renal lesions identified on today's noncontrast CT examination. Unenhanced appearance of the urinary bladder is normal. Bilateral adrenal glands are normal in appearance. Stomach/Bowel: Unenhanced appearance of the stomach is normal. Small duodenal diverticulum extending off the medial aspect of the second portion of the duodenum. No surrounding inflammatory changes. No pathologic dilatation of small bowel or colon. Numerous colonic diverticulae are noted, most evident in the sigmoid colon, without surrounding inflammatory changes to suggest an acute diverticulitis at this time. Normal appendix. Vascular/Lymphatic: Aortic atherosclerosis. No lymphadenopathy noted in the abdomen or pelvis. Reproductive: Prostate gland and seminal vesicles are unremarkable in appearance. Other: No significant volume of ascites.  No pneumoperitoneum. Musculoskeletal: There are no aggressive appearing lytic or blastic lesions noted in the visualized portions of the skeleton. IMPRESSION: 1. No acute findings are noted in the abdomen or pelvis to account for the patient's symptoms. 2. Colonic diverticulosis without evidence of acute diverticulitis at this time. 3. 2 mm nonobstructive calculus in the upper pole collecting system of the right kidney. 4.  Multiple low-attenuation lesions in the liver, incompletely characterized on today's noncontrast CT examination, but favored to represent cysts. These could be definitively characterized with nonemergent MRI of the abdomen with and without IV gadolinium if there is any clinical concern for metastatic disease to the liver. 5. Aortic atherosclerosis, in addition to left main and 3 vessel coronary artery disease. 6. Findings in the lungs concerning for probable interstitial lung disease. Outpatient referral to Pulmonology for further evaluation is recommended. Additionally, high-resolution chest CT in 12 months is suggested to assess for temporal changes in the appearance of the lung parenchyma. Aortic Atherosclerosis (ICD10-I70.0). Electronically Signed   By: Trudie Reed M.D.   On: 10/19/2017 17:06   Ct Chest Wo Contrast  Result Date: 10/19/2017 CLINICAL DATA:  84 year old male with history of worsening weakness for the past 3-4 days. Nausea and vomiting. EXAM: CT CHEST, ABDOMEN AND PELVIS WITHOUT CONTRAST TECHNIQUE: Multidetector CT imaging of the chest, abdomen and pelvis was performed following the standard protocol without IV contrast. COMPARISON:  None. FINDINGS: CT CHEST FINDINGS Cardiovascular: Heart size is normal. There is no significant pericardial fluid, thickening or pericardial calcification. There is aortic atherosclerosis, as well as atherosclerosis of the great vessels of the mediastinum and the coronary arteries, including calcified atherosclerotic plaque in the left main, left anterior descending, left circumflex and right coronary arteries. Mediastinum/Nodes: No pathologically enlarged mediastinal or hilar lymph nodes. Please note that accurate exclusion of hilar adenopathy is limited on noncontrast CT scans. Esophagus is unremarkable in appearance. No axillary lymphadenopathy. Lungs/Pleura: Patchy areas of peripheral predominant ground-glass attenuation and septal thickening are noted  throughout the mid to lower lungs bilaterally. Throughout the same distribution there is mild cylindrical bronchiectasis. No honeycombing. No acute consolidative airspace disease. No pleural effusions. No suspicious appearing pulmonary nodules or masses are noted. Musculoskeletal: There are no aggressive appearing lytic or blastic lesions noted in the visualized portions of the skeleton. CT ABDOMEN PELVIS FINDINGS Hepatobiliary: Multiple low-attenuation lesions are noted in the liver, incompletely characterized on today's noncontrast CT examination, but favored to represent cysts, largest of which measures 2.1 x 1.4 cm in segment 2 of the liver. Status post cholecystectomy. Pancreas: No definite pancreatic mass  or peripancreatic fluid or inflammatory changes are noted on today's noncontrast CT examination. Spleen: Unremarkable. Adrenals/Urinary Tract: 2 mm nonobstructive calculus in the upper pole collecting system of the right kidney. No additional calculi are noted within the collecting system of either kidney, along the course of either ureter, or within the lumen of the urinary bladder. No hydroureteronephrosis. Mild atrophy of both kidneys. No definite suspicious renal lesions identified on today's noncontrast CT examination. Unenhanced appearance of the urinary bladder is normal. Bilateral adrenal glands are normal in appearance. Stomach/Bowel: Unenhanced appearance of the stomach is normal. Small duodenal diverticulum extending off the medial aspect of the second portion of the duodenum. No surrounding inflammatory changes. No pathologic dilatation of small bowel or colon. Numerous colonic diverticulae are noted, most evident in the sigmoid colon, without surrounding inflammatory changes to suggest an acute diverticulitis at this time. Normal appendix. Vascular/Lymphatic: Aortic atherosclerosis. No lymphadenopathy noted in the abdomen or pelvis. Reproductive: Prostate gland and seminal vesicles are  unremarkable in appearance. Other: No significant volume of ascites.  No pneumoperitoneum. Musculoskeletal: There are no aggressive appearing lytic or blastic lesions noted in the visualized portions of the skeleton. IMPRESSION: 1. No acute findings are noted in the abdomen or pelvis to account for the patient's symptoms. 2. Colonic diverticulosis without evidence of acute diverticulitis at this time. 3. 2 mm nonobstructive calculus in the upper pole collecting system of the right kidney. 4. Multiple low-attenuation lesions in the liver, incompletely characterized on today's noncontrast CT examination, but favored to represent cysts. These could be definitively characterized with nonemergent MRI of the abdomen with and without IV gadolinium if there is any clinical concern for metastatic disease to the liver. 5. Aortic atherosclerosis, in addition to left main and 3 vessel coronary artery disease. 6. Findings in the lungs concerning for probable interstitial lung disease. Outpatient referral to Pulmonology for further evaluation is recommended. Additionally, high-resolution chest CT in 12 months is suggested to assess for temporal changes in the appearance of the lung parenchyma. Aortic Atherosclerosis (ICD10-I70.0). Electronically Signed   By: Trudie Reed M.D.   On: 10/19/2017 17:06   Dg Chest Port 1 View  Result Date: 10/19/2017 CLINICAL DATA:  Weakness. EXAM: PORTABLE CHEST 1 VIEW COMPARISON:  None. FINDINGS: The heart size and mediastinal contours are within normal limits. Both lungs are clear. No pneumothorax or pleural effusion is noted. The visualized skeletal structures are unremarkable. IMPRESSION: No acute cardiopulmonary abnormality seen. Electronically Signed   By: Lupita Raider, M.D.   On: 10/19/2017 15:43    ____________________________________________   PROCEDURES Procedures  ____________________________________________  DIFFERENTIAL DIAGNOSIS   Urinary tract infection,  pneumonia, biliary disease, pancreatitis, dehydration, electrolyte abnormality  CLINICAL IMPRESSION / ASSESSMENT AND PLAN / ED COURSE  Pertinent labs & imaging results that were available during my care of the patient were reviewed by me and considered in my medical decision making (see chart for details).    Presents with generalized weakness.  Will pursue an infectious work-up as well as give IV fluids for hydration although on presentation and initial assessment, he is not septic.  Clinical Course as of Oct 19 2000  Tue Oct 19, 2017  1513 Patient presents with generalized weakness.  Will check rectal temp, possibly has a fever even though oral temp was not significantly elevated.  His heart rate is rate controlled atrial fibrillation and not tachycardia.  He is not septic.  Check labs urinalysis chest x-ray, IV fluids for hydration.   [PS]  1612  Marked leukocytosis.  Continue IV fluids, start empiric antibiotics.  Plan to obtain CT chest abdomen pelvis to look for occult pneumonia versus abdominal pathology such as biliary disease pancreatitis or colitis.  WBC(!): 24.5 [PS]    Clinical Course User Index [PS] Sharman CheekStafford, Absalom Aro, MD     ----------------------------------------- 8:02 PM on 10/19/2017 -----------------------------------------  CT is negative.  Labs overall unrevealing, urinalysis unremarkable.  No clear cause for his symptoms but with profound generalized weakness and leukocytosis and a temperature I would consider a fever and an 84 year old man, concerns for underlying occult infection.  Discussed with hospitalist for further evaluate of the patient  ____________________________________________   FINAL CLINICAL IMPRESSION(S) / ED DIAGNOSES    Final diagnoses:  Generalized weakness  Leukocytosis, unspecified type     ED Discharge Orders    None      Portions of this note were generated with dragon dictation software. Dictation errors may occur despite best  attempts at proofreading.    Sharman CheekStafford, Laini Urick, MD 10/19/17 2003

## 2017-10-19 NOTE — Progress Notes (Signed)
ANTICOAGULATION CONSULT NOTE - Initial Consult  Pharmacy Consult for Warfarin  Indication: atrial fibrillation  Allergies  Allergen Reactions  . Morphine Rash    unknown  . Hydrocodone-Acetaminophen Rash    agitation    Patient Measurements: Height: 6\' 8"  (203.2 cm) Weight: 180 lb 12.8 oz (82 kg) IBW/kg (Calculated) : 96 Heparin Dosing Weight:   Vital Signs: Temp: 98.9 F (37.2 C) (09/24 2145) Temp Source: Oral (09/24 2145) BP: 149/64 (09/24 2145) Pulse Rate: 114 (09/24 2145)  Labs: Recent Labs    10/19/17 1537  HGB 12.8*  HCT 36.7*  PLT 173  APTT 26  LABPROT 17.1*  INR 1.41  CREATININE 1.56*  TROPONINI 0.04*    Estimated Creatinine Clearance: 42.3 mL/min (A) (by C-G formula based on SCr of 1.56 mg/dL (H)).   Medical History: Past Medical History:  Diagnosis Date  . A-fib (HCC)   . MI (myocardial infarction) (HCC)    x6    Medications:  Medications Prior to Admission  Medication Sig Dispense Refill Last Dose  . allopurinol (ZYLOPRIM) 100 MG tablet Take 1 tablet by mouth daily.   10/19/2017 at Unknown time  . atorvastatin (LIPITOR) 40 MG tablet Take 40 mg by mouth daily.   10/19/2017 at Unknown time  . hydrochlorothiazide (HYDRODIURIL) 25 MG tablet Take 1 tablet by mouth daily.   10/19/2017 at Unknown time  . isosorbide mononitrate (IMDUR) 30 MG 24 hr tablet Take 1 tablet by mouth daily.   10/19/2017 at Unknown time  . nitroGLYCERIN (NITROSTAT) 0.4 MG SL tablet Place 0.4 mg under the tongue every 5 (five) minutes as needed for chest pain.   PRN at PRN  . pantoprazole (PROTONIX) 40 MG tablet Take 1 tablet by mouth daily.   10/19/2017 at Unknown time  . potassium chloride SA (K-DUR,KLOR-CON) 20 MEQ tablet Take 20 mEq by mouth 2 (two) times daily.   10/19/2017 at Unknown time  . tamsulosin (FLOMAX) 0.4 MG CAPS capsule Take 1 capsule by mouth 2 (two) times daily.   10/19/2017 at Unknown time  . verapamil (CALAN) 80 MG tablet Take 80 mg by mouth 2 (two) times daily.    10/19/2017 at Unknown time  . warfarin (COUMADIN) 2.5 MG tablet Take 1.25 mg by mouth at bedtime.   10/18/2017 at Unknown time  . acetaminophen (TYLENOL) 325 MG tablet Take 1 tablet by mouth 3 (three) times daily as needed.   PRN at PRN    Assessment: 9/24:  INR @ 15:37 = 1.41 Pt on warfarin 1.25 mg PO daily, last dose was on 9/23.   Goal of Therapy:  INR 2-3   Plan:  INR on 9/24 subtherapeutic.   Will order warfarin 2 mg PO X 1 and recheck INR on 9/25 with AM labs.   Tylee Newby D 10/19/2017,9:47 PM

## 2017-10-19 NOTE — Progress Notes (Signed)
Family Meeting Note  Advance Directive:yes  Today a meeting took place with the Patient.   The following clinical team members were present during this meeting:MD  The following were discussed:Patient's diagnosis: Sepsis with tachycardia, acute kidney injury, tachypnea, other comorbidities, liver lesions on CT scan with interstitial lung disease, comorbidities as documented below and treatment plan of care discussed in detail with the patient.  He verbalized understanding of the plan.    Transient ischemic attack 12/03/2012   . S/P coronary artery stent placement 12/03/2012  . Atrial fibrillation (HCC) 06/14/2012  . 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 Other dyspnea and respiratory abnormality 06/14/2012  . Coronary artery disease involving native coronary artery of native heart without angina pectoris      Patient's progosis: Unable to determine and Goals for treatment: Full Code Daughter [name] is a healthcare POA   Additional follow-up to be provided: Hospitalist  Time spent during discussion:17 min  Jeremiah LabAruna Allard Lightsey, MD

## 2017-10-20 LAB — CBC
HEMATOCRIT: 32.7 % — AB (ref 40.0–52.0)
HEMOGLOBIN: 11 g/dL — AB (ref 13.0–18.0)
MCH: 31.6 pg (ref 26.0–34.0)
MCHC: 33.5 g/dL (ref 32.0–36.0)
MCV: 94.4 fL (ref 80.0–100.0)
Platelets: 165 10*3/uL (ref 150–440)
RBC: 3.46 MIL/uL — ABNORMAL LOW (ref 4.40–5.90)
RDW: 14.3 % (ref 11.5–14.5)
WBC: 21.1 10*3/uL — ABNORMAL HIGH (ref 3.8–10.6)

## 2017-10-20 LAB — COMPREHENSIVE METABOLIC PANEL
ALK PHOS: 37 U/L — AB (ref 38–126)
ALT: 30 U/L (ref 0–44)
ANION GAP: 9 (ref 5–15)
AST: 170 U/L — AB (ref 15–41)
Albumin: 2.6 g/dL — ABNORMAL LOW (ref 3.5–5.0)
BILIRUBIN TOTAL: 1.5 mg/dL — AB (ref 0.3–1.2)
BUN: 18 mg/dL (ref 8–23)
CALCIUM: 8.1 mg/dL — AB (ref 8.9–10.3)
CO2: 24 mmol/L (ref 22–32)
CREATININE: 1.29 mg/dL — AB (ref 0.61–1.24)
Chloride: 108 mmol/L (ref 98–111)
GFR calc Af Amer: 58 mL/min — ABNORMAL LOW (ref >60)
GFR, EST NON AFRICAN AMERICAN: 50 mL/min — AB (ref >60)
Glucose, Bld: 110 mg/dL — ABNORMAL HIGH (ref 70–99)
Potassium: 3 mmol/L — ABNORMAL LOW (ref 3.5–5.1)
Sodium: 141 mmol/L (ref 135–145)
TOTAL PROTEIN: 5.8 g/dL — AB (ref 6.5–8.1)

## 2017-10-20 LAB — TROPONIN I
TROPONIN I: 0.06 ng/mL — AB (ref <0.03)
Troponin I: 0.06 ng/mL (ref <0.03)

## 2017-10-20 LAB — MRSA PCR SCREENING: MRSA BY PCR: NEGATIVE

## 2017-10-20 LAB — CORTISOL-AM, BLOOD: CORTISOL - AM: 23.6 ug/dL — AB (ref 6.7–22.6)

## 2017-10-20 LAB — PROTIME-INR
INR: 1.33
Prothrombin Time: 16.4 seconds — ABNORMAL HIGH (ref 11.4–15.2)

## 2017-10-20 LAB — LACTIC ACID, PLASMA
LACTIC ACID, VENOUS: 0.9 mmol/L (ref 0.5–1.9)
LACTIC ACID, VENOUS: 1.5 mmol/L (ref 0.5–1.9)

## 2017-10-20 LAB — PROCALCITONIN: Procalcitonin: 0.24 ng/mL

## 2017-10-20 LAB — MAGNESIUM: MAGNESIUM: 1.5 mg/dL — AB (ref 1.7–2.4)

## 2017-10-20 MED ORDER — POTASSIUM CHLORIDE CRYS ER 20 MEQ PO TBCR
40.0000 meq | EXTENDED_RELEASE_TABLET | ORAL | Status: AC
  Administered 2017-10-20 (×2): 40 meq via ORAL
  Filled 2017-10-20 (×2): qty 2

## 2017-10-20 MED ORDER — WARFARIN SODIUM 2 MG PO TABS
2.0000 mg | ORAL_TABLET | Freq: Every day | ORAL | Status: DC
  Administered 2017-10-20: 2 mg via ORAL
  Filled 2017-10-20: qty 1

## 2017-10-20 MED ORDER — MAGNESIUM SULFATE IN D5W 1-5 GM/100ML-% IV SOLN
1.0000 g | Freq: Once | INTRAVENOUS | Status: AC
  Administered 2017-10-20: 15:00:00 1 g via INTRAVENOUS
  Filled 2017-10-20: qty 100

## 2017-10-20 MED ORDER — METOPROLOL TARTRATE 25 MG PO TABS
25.0000 mg | ORAL_TABLET | Freq: Two times a day (BID) | ORAL | Status: DC
  Filled 2017-10-20: qty 1

## 2017-10-20 MED ORDER — BENZONATATE 100 MG PO CAPS
100.0000 mg | ORAL_CAPSULE | Freq: Three times a day (TID) | ORAL | Status: DC
  Administered 2017-10-20 – 2017-10-25 (×15): 100 mg via ORAL
  Filled 2017-10-20 (×15): qty 1

## 2017-10-20 MED ORDER — HYDROCOD POLST-CPM POLST ER 10-8 MG/5ML PO SUER
5.0000 mL | Freq: Two times a day (BID) | ORAL | Status: DC | PRN
  Administered 2017-10-25: 5 mL via ORAL
  Filled 2017-10-20 (×2): qty 5

## 2017-10-20 MED ORDER — VANCOMYCIN HCL 10 G IV SOLR
1500.0000 mg | INTRAVENOUS | Status: DC
  Filled 2017-10-20: qty 1500

## 2017-10-20 NOTE — Progress Notes (Signed)
ANTICOAGULATION CONSULT NOTE - Initial Consult  Pharmacy Consult for Warfarin  Indication: atrial fibrillation  Allergies  Allergen Reactions  . Morphine Rash    unknown  . Hydrocodone-Acetaminophen Rash    agitation    Patient Measurements: Height: 5\' 11"  (180.3 cm) Weight: 226 lb (102.5 kg) IBW/kg (Calculated) : 75.3 Heparin Dosing Weight:   Vital Signs: Temp: 98.6 F (37 C) (09/25 0436) Temp Source: Oral (09/25 0436) BP: 100/68 (09/25 0436) Pulse Rate: 110 (09/25 0623)  Labs: Recent Labs    10/19/17 1537 10/19/17 2143 10/20/17 0341 10/20/17 0837  HGB 12.8*  --   --  11.0*  HCT 36.7*  --   --  32.7*  PLT 173  --   --  165  APTT 26  --   --   --   LABPROT 17.1*  --   --  16.4*  INR 1.41  --   --  1.33  CREATININE 1.56*  --   --  1.29*  TROPONINI 0.04* 0.05* 0.06* 0.06*    Estimated Creatinine Clearance: 53.8 mL/min (A) (by C-G formula based on SCr of 1.29 mg/dL (H)).   Medical History: Past Medical History:  Diagnosis Date  . A-fib (HCC)   . MI (myocardial infarction) (HCC)    x6    Medications:  Medications Prior to Admission  Medication Sig Dispense Refill Last Dose  . allopurinol (ZYLOPRIM) 100 MG tablet Take 1 tablet by mouth daily.   10/19/2017 at Unknown time  . atorvastatin (LIPITOR) 40 MG tablet Take 40 mg by mouth daily.   10/19/2017 at Unknown time  . hydrochlorothiazide (HYDRODIURIL) 25 MG tablet Take 1 tablet by mouth daily.   10/19/2017 at Unknown time  . isosorbide mononitrate (IMDUR) 30 MG 24 hr tablet Take 1 tablet by mouth daily.   10/19/2017 at Unknown time  . nitroGLYCERIN (NITROSTAT) 0.4 MG SL tablet Place 0.4 mg under the tongue every 5 (five) minutes as needed for chest pain.   PRN at PRN  . pantoprazole (PROTONIX) 40 MG tablet Take 1 tablet by mouth daily.   10/19/2017 at Unknown time  . potassium chloride SA (K-DUR,KLOR-CON) 20 MEQ tablet Take 20 mEq by mouth 2 (two) times daily.   10/19/2017 at Unknown time  . tamsulosin (FLOMAX) 0.4  MG CAPS capsule Take 1 capsule by mouth 2 (two) times daily.   10/19/2017 at Unknown time  . verapamil (CALAN) 80 MG tablet Take 80 mg by mouth 2 (two) times daily.   10/19/2017 at Unknown time  . warfarin (COUMADIN) 2.5 MG tablet Take 1.25 mg by mouth at bedtime.   10/18/2017 at Unknown time  . acetaminophen (TYLENOL) 325 MG tablet Take 1 tablet by mouth 3 (three) times daily as needed.   PRN at PRN    Assessment: 84 yom cc weakness with PMH AF on VKA, MI x 6, history of TIA. Sepsis (of unknown source) protocol was initiated due to leukocytosis and tachycardia. 9/25 PCT 0.24 and CXR had no noted abnormality. Patient also noted to have AKI and is being rehydrated.   WARFARIN COURSE DATE INR DOSE  9/24 1.41 2 mg 9/25 1.33  Goal of Therapy:  INR 2-3   Plan:  INR still subtherapeutic today. Patient's albumin is low, 2.6 g/L, and he is on metronidazole which can elevate the INR. Due to potential interactions with disease states (sepsis, hypoalbuminiemia) and medications (metronidazole) we will continue a modestly elevated VKA dose of warfarin 2 mg po daily and monitor INR daily  until INR therapeutic x 2.    Carola Frost, Pharm.D., BCPS Clinical Pharmacist 10/20/2017,9:25 AM

## 2017-10-20 NOTE — Consult Note (Signed)
Pharmacy Antibiotic Note  Jeremiah James is a 84 y.o. male admitted on 10/19/2017 with sepsis.  Pharmacy has been consulted for Cefepime and vancomycin  Dosing. Patient received vancomycin 1g IV x 1 dose and cefepime 2g IV x 1 dose in ED.   Plan: SCr significantly improved since admission. Modify vancomycin to 1.5 gm IV Q18H, predicted trough 18 mcg/ml, although renal function estimates are changing. Pharmacy will continue to follow and adjust as needed to maintain trough 15 to 20 mcg/ml.   Contiue cefepime 2g IV every 12 hours.   Height: 5\' 11"  (180.3 cm) Weight: 226 lb (102.5 kg) IBW/kg (Calculated) : 75.3  Temp (24hrs), Avg:99.1 F (37.3 C), Min:98.6 F (37 C), Max:99.9 F (37.7 C)  Recent Labs  Lab 10/19/17 1537 10/19/17 2143 10/20/17 0034 10/20/17 0341 10/20/17 0837  WBC 24.5*  --   --   --  21.1*  CREATININE 1.56*  --   --   --  1.29*  LATICACIDVEN 1.6 2.0* 1.5 0.9  --     Estimated Creatinine Clearance: 53.8 mL/min (A) (by C-G formula based on SCr of 1.29 mg/dL (H)).    Allergies  Allergen Reactions  . Morphine Rash    unknown  . Hydrocodone-Acetaminophen Rash    agitation    Antimicrobials this admission: 9/24 Vancomycin  >>  9/24 Cefepime >>  9/24 Flagyl>>  Microbiology results: 9/24  BCx: pending 9/24  UCx: pending  Thank you for allowing pharmacy to be a part of this patient's care.  Carola Frost, PharmD, BCPS Clinical Pharmacist 10/20/2017 9:48 AM

## 2017-10-20 NOTE — Plan of Care (Signed)
  Problem: Education: Goal: Knowledge of General Education information will improve Description Including pain rating scale, medication(s)/side effects and non-pharmacologic comfort measures Outcome: Progressing   Problem: Clinical Measurements: Goal: Ability to maintain clinical measurements within normal limits will improve Outcome: Progressing Goal: Respiratory complications will improve Outcome: Progressing   Problem: Elimination: Goal: Will not experience complications related to urinary retention Outcome: Progressing   Problem: Safety: Goal: Ability to remain free from injury will improve Outcome: Progressing   Problem: Skin Integrity: Goal: Risk for impaired skin integrity will decrease Outcome: Progressing

## 2017-10-20 NOTE — Progress Notes (Signed)
Admitted pnt overnight. Administered IV antibiotics as ordered. Pnt connected to telemetry monitoring per order. Pnt has history of chronic AFIB and MI.   Pnts troponin continued to increase overnight(0.04 to 0.05 and then 0.06.);Notified Dr. Sheryle Hail. No new orders at that time. Vital signs remain stable overnight

## 2017-10-20 NOTE — Evaluation (Signed)
Physical Therapy Evaluation Patient Details Name: Jeremiah James MRN: [id number] DOB: 21-Feb-1933 Today's Date: 10/20/2017   History of Present Illness  Pt is an 84 y.o. male with a known history of chronic atrial fibrillation on Coumadin, coronary artery disease status post MI, history of TIA with generalized weakness presented to the ED with worsening weakness.  Patient was unable to get out of the chair and he fell because he was too weak to walk.  Patient was tachycardic, sepsis protocol implemented cultures were done and patient was started on empiric IV antibiotics hospitalist team is called to admit the patient.  Assessment includes: Sepsis of unclear etiology, AKI, A-fib with RVR, CAD, liver lesions, and interstitial lung disease.      Clinical Impression  Of note pt's K+ dropped from 3.7 to 3.0 with recent HR noted to be 49 bpm.  Dr. Nemiah Commander contacted and gave verbal order for pt OK to participate as tolerated with PT with no restrictions.  Pt presents with deficits in strength, transfers, mobility, gait, balance, and activity tolerance.  Pt required min A with all bed mobility tasks and sit to/from stand transfers.  Pt was only able to take several small, shuffling steps from the bed to the chair with min instability that pt was able to self-correct.  Pt's SpO2 on room air remained 93-94% throughout the session with HR in the 90s.  Pt reported no adverse symptoms other than fatigue during the session.  Pt presents overall with a significant decline in functional mobility and stability compared to his stated baseline level and would not be safe to return to his prior living situation at this time.  Pt will benefit from PT services in a SNF setting upon discharge to safely address above deficits for decreased caregiver assistance and eventual return to PLOF.       Follow Up Recommendations SNF    Equipment Recommendations  Other (comment);Rolling walker with 5" wheels(TBD at next venue of  care)    Recommendations for Other Services       Precautions / Restrictions Precautions Precautions: Fall Restrictions Weight Bearing Restrictions: No      Mobility  Bed Mobility Overal bed mobility: Needs Assistance Bed Mobility: Supine to Sit;Sit to Supine     Supine to sit: Min assist Sit to supine: Min assist   General bed mobility comments: Min A for BLEs in and out of bed during sup to/from sit and for trunk to fully upright during sup to sit  Transfers Overall transfer level: Needs assistance Equipment used: Rolling walker (2 wheeled) Transfers: Sit to/from Stand Sit to Stand: Min assist;From elevated surface         General transfer comment: Increased effort required to stand from an elevated EOB with min instability upon initial stand that pt was able to self correct  Ambulation/Gait Ambulation/Gait assistance: Min guard Gait Distance (Feet): 3 Feet Assistive device: Rolling walker (2 wheeled) Gait Pattern/deviations: Step-to pattern;Decreased step length - right;Decreased step length - left;Trunk flexed;Shuffle Gait velocity: Decreased   General Gait Details: Slow, effortful, shuffling steps from the bed to the chair with flexed trunk posture  Stairs            Wheelchair Mobility    Modified Rankin (Stroke Patients Only)       Balance Overall balance assessment: Mild deficits observed, not formally tested  Pertinent Vitals/Pain Pain Assessment: No/denies pain    Home Living Family/patient expects to be discharged to:: Private residence Living Arrangements: Alone Available Help at Discharge: Friend(s);Available PRN/intermittently Type of Home: Apartment Home Access: Level entry     Home Layout: One level Home Equipment: Walker - 4 wheels;Walker - standard      Prior Function Level of Independence: Needs assistance   Gait / Transfers Assistance Needed: Mod Ind amb  limited community distances with a rollator or SW, recent fall is the only fall in the last 3-4 years  ADL's / Homemaking Assistance Needed: Daughter drives pt to appointments and brings pt groceries, Ind with bathing and dressing        Hand Dominance        Extremity/Trunk Assessment   Upper Extremity Assessment Upper Extremity Assessment: Overall WFL for tasks assessed    Lower Extremity Assessment Lower Extremity Assessment: Generalized weakness       Communication   Communication: No difficulties  Cognition Arousal/Alertness: Awake/alert Behavior During Therapy: WFL for tasks assessed/performed Overall Cognitive Status: Within Functional Limits for tasks assessed                                        General Comments      Exercises Total Joint Exercises Ankle Circles/Pumps: AROM;Both;10 reps Quad Sets: Strengthening;Both;10 reps Gluteal Sets: Strengthening;Both;10 reps Heel Slides: AROM;AAROM;Both;5 reps Hip ABduction/ADduction: AROM;AAROM;Both;5 reps Straight Leg Raises: AROM;AAROM;Both;5 reps Long Arc Quad: AROM;Both;10 reps Knee Flexion: AROM;Both;10 reps Marching in Standing: AROM;Both;5 reps;Standing   Assessment/Plan    PT Assessment Patient needs continued PT services  PT Problem List Decreased strength;Decreased activity tolerance;Decreased balance;Decreased mobility       PT Treatment Interventions DME instruction;Gait training;Functional mobility training;Balance training;Therapeutic exercise;Therapeutic activities;Patient/family education    PT Goals (Current goals can be found in the Care Plan section)  Acute Rehab PT Goals Patient Stated Goal: To get stronger PT Goal Formulation: With patient Time For Goal Achievement: 11/02/17 Potential to Achieve Goals: Good    Frequency Min 2X/week   Barriers to discharge Decreased caregiver support      Co-evaluation               AM-PAC PT "6 Clicks" Daily Activity   Outcome Measure Difficulty turning over in bed (including adjusting bedclothes, sheets and blankets)?: Unable Difficulty moving from lying on back to sitting on the side of the bed? : Unable Difficulty sitting down on and standing up from a chair with arms (e.g., wheelchair, bedside commode, etc,.)?: Unable Help needed moving to and from a bed to chair (including a wheelchair)?: A Little Help needed walking in hospital room?: Total Help needed climbing 3-5 steps with a railing? : Total 6 Click Score: 8    End of Session Equipment Utilized During Treatment: Gait belt Activity Tolerance: Patient limited by fatigue Patient left: in chair;with chair alarm set;with call bell/phone within reach Nurse Communication: Mobility status PT Visit Diagnosis: Muscle weakness (generalized) (M62.81);Difficulty in walking, not elsewhere classified (R26.2)    Time: 1400-1444 PT Time Calculation (min) (ACUTE ONLY): 44 min   Charges:   PT Evaluation $PT Eval Low Complexity: 1 Low PT Treatments $Therapeutic Exercise: 8-22 mins        D. Scott Karolyn Messing PT, DPT 10/20/17, 3:50 PM

## 2017-10-20 NOTE — Progress Notes (Signed)
[facility] - [place] [facility]   PATIENT NAME: Jeremiah James    MR#:  [id number]  DATE OF BIRTH:  1933/12/08  SUBJECTIVE:  CHIEF COMPLAINT:   Chief Complaint  Patient presents with  . Weakness   Complains of cough and shortness of breath  REVIEW OF SYSTEMS:  Review of Systems  Constitutional: Positive for malaise/fatigue. Negative for chills and fever.  HENT: Negative for congestion, ear discharge, hearing loss and nosebleeds.   Eyes: Negative for blurred vision and double vision.  Respiratory: Positive for cough and shortness of breath. Negative for wheezing.   Cardiovascular: Negative for chest pain and palpitations.  Gastrointestinal: Negative for abdominal pain, constipation, diarrhea, nausea and vomiting.  Genitourinary: Negative for dysuria.  Musculoskeletal: Negative for myalgias.  Neurological: Negative for dizziness, sensory change, speech change, focal weakness, seizures and headaches.  Psychiatric/Behavioral: Negative for depression.    DRUG ALLERGIES:   Allergies  Allergen Reactions  . Morphine Rash    unknown  . Hydrocodone-Acetaminophen Rash    agitation    VITALS:  Blood pressure 129/63, pulse 99, temperature 98.4 F (36.9 C), temperature source Oral, resp. rate 19, height 5\' 11"  (1.803 m), weight 102.5 kg, SpO2 94 %.  PHYSICAL EXAMINATION:  Physical Exam  GENERAL:  84 y.o.-year-old patient lying in the bed with no acute distress.  EYES: Pupils equal, round, reactive to light and accommodation. No scleral icterus. Extraocular muscles intact.  HEENT: Head atraumatic, normocephalic. Oropharynx and nasopharynx clear.  NECK:  Supple, no jugular venous distention. No thyroid enlargement, no tenderness.  LUNGS: Normal breath sounds bilaterally, no wheezing, rhonchi or crepitation. Fine crackles at left base.No use of accessory muscles of respiration.  CARDIOVASCULAR: S1, S2 normal. No  rubs, or gallops. 3/6 systolic murmur  present. ABDOMEN: Soft, nontender, nondistended. Bowel sounds present. No organomegaly or mass.  EXTREMITIES: No pedal edema, cyanosis, or clubbing.  NEUROLOGIC: Cranial nerves II through XII are intact. Muscle strength 5/5 in all extremities. Sensation intact. Gait not checked. Global weakness noted. PSYCHIATRIC: The patient is alert and oriented x 3.  SKIN: No obvious rash, lesion, or ulcer.    LABORATORY PANEL:   CBC Recent Labs  Lab 10/20/17 0837  WBC 21.1*  HGB 11.0*  HCT 32.7*  PLT 165   ------------------------------------------------------------------------------------------------------------------  Chemistries  Recent Labs  Lab 10/20/17 0837  NA 141  K 3.0*  CL 108  CO2 24  GLUCOSE 110*  BUN 18  CREATININE 1.29*  CALCIUM 8.1*  MG 1.5*  AST 170*  ALT 30  ALKPHOS 37*  BILITOT 1.5*   ------------------------------------------------------------------------------------------------------------------  Cardiac Enzymes Recent Labs  Lab 10/20/17 0837  TROPONINI 0.06*   ------------------------------------------------------------------------------------------------------------------  RADIOLOGY:  Ct Abdomen Pelvis Wo Contrast  Result Date: 10/19/2017 CLINICAL DATA:  84 year old male with history of worsening weakness for the past 3-4 days. Nausea and vomiting. EXAM: CT CHEST, ABDOMEN AND PELVIS WITHOUT CONTRAST TECHNIQUE: Multidetector CT imaging of the chest, abdomen and pelvis was performed following the standard protocol without IV contrast. COMPARISON:  None. FINDINGS: CT CHEST FINDINGS Cardiovascular: Heart size is normal. There is no significant pericardial fluid, thickening or pericardial calcification. There is aortic atherosclerosis, as well as atherosclerosis of the great vessels of the mediastinum and the coronary arteries, including calcified atherosclerotic plaque in the left main, left anterior descending, left circumflex and right coronary arteries.  Mediastinum/Nodes: No pathologically enlarged mediastinal or hilar lymph nodes. Please note that accurate exclusion of hilar adenopathy is limited on noncontrast CT scans. Esophagus  is unremarkable in appearance. No axillary lymphadenopathy. Lungs/Pleura: Patchy areas of peripheral predominant ground-glass attenuation and septal thickening are noted throughout the mid to lower lungs bilaterally. Throughout the same distribution there is mild cylindrical bronchiectasis. No honeycombing. No acute consolidative airspace disease. No pleural effusions. No suspicious appearing pulmonary nodules or masses are noted. Musculoskeletal: There are no aggressive appearing lytic or blastic lesions noted in the visualized portions of the skeleton. CT ABDOMEN PELVIS FINDINGS Hepatobiliary: Multiple low-attenuation lesions are noted in the liver, incompletely characterized on today's noncontrast CT examination, but favored to represent cysts, largest of which measures 2.1 x 1.4 cm in segment 2 of the liver. Status post cholecystectomy. Pancreas: No definite pancreatic mass or peripancreatic fluid or inflammatory changes are noted on today's noncontrast CT examination. Spleen: Unremarkable. Adrenals/Urinary Tract: 2 mm nonobstructive calculus in the upper pole collecting system of the right kidney. No additional calculi are noted within the collecting system of either kidney, along the course of either ureter, or within the lumen of the urinary bladder. No hydroureteronephrosis. Mild atrophy of both kidneys. No definite suspicious renal lesions identified on today's noncontrast CT examination. Unenhanced appearance of the urinary bladder is normal. Bilateral adrenal glands are normal in appearance. Stomach/Bowel: Unenhanced appearance of the stomach is normal. Small duodenal diverticulum extending off the medial aspect of the second portion of the duodenum. No surrounding inflammatory changes. No pathologic dilatation of small bowel  or colon. Numerous colonic diverticulae are noted, most evident in the sigmoid colon, without surrounding inflammatory changes to suggest an acute diverticulitis at this time. Normal appendix. Vascular/Lymphatic: Aortic atherosclerosis. No lymphadenopathy noted in the abdomen or pelvis. Reproductive: Prostate gland and seminal vesicles are unremarkable in appearance. Other: No significant volume of ascites.  No pneumoperitoneum. Musculoskeletal: There are no aggressive appearing lytic or blastic lesions noted in the visualized portions of the skeleton. IMPRESSION: 1. No acute findings are noted in the abdomen or pelvis to account for the patient's symptoms. 2. Colonic diverticulosis without evidence of acute diverticulitis at this time. 3. 2 mm nonobstructive calculus in the upper pole collecting system of the right kidney. 4. Multiple low-attenuation lesions in the liver, incompletely characterized on today's noncontrast CT examination, but favored to represent cysts. These could be definitively characterized with nonemergent MRI of the abdomen with and without IV gadolinium if there is any clinical concern for metastatic disease to the liver. 5. Aortic atherosclerosis, in addition to left main and 3 vessel coronary artery disease. 6. Findings in the lungs concerning for probable interstitial lung disease. Outpatient referral to Pulmonology for further evaluation is recommended. Additionally, high-resolution chest CT in 12 months is suggested to assess for temporal changes in the appearance of the lung parenchyma. Aortic Atherosclerosis (ICD10-I70.0). Electronically Signed   By: Trudie Reed M.D.   On: 10/19/2017 17:06   Ct Chest Wo Contrast  Result Date: 10/19/2017 CLINICAL DATA:  84 year old male with history of worsening weakness for the past 3-4 days. Nausea and vomiting. EXAM: CT CHEST, ABDOMEN AND PELVIS WITHOUT CONTRAST TECHNIQUE: Multidetector CT imaging of the chest, abdomen and pelvis was  performed following the standard protocol without IV contrast. COMPARISON:  None. FINDINGS: CT CHEST FINDINGS Cardiovascular: Heart size is normal. There is no significant pericardial fluid, thickening or pericardial calcification. There is aortic atherosclerosis, as well as atherosclerosis of the great vessels of the mediastinum and the coronary arteries, including calcified atherosclerotic plaque in the left main, left anterior descending, left circumflex and right coronary arteries. Mediastinum/Nodes: No pathologically enlarged  mediastinal or hilar lymph nodes. Please note that accurate exclusion of hilar adenopathy is limited on noncontrast CT scans. Esophagus is unremarkable in appearance. No axillary lymphadenopathy. Lungs/Pleura: Patchy areas of peripheral predominant ground-glass attenuation and septal thickening are noted throughout the mid to lower lungs bilaterally. Throughout the same distribution there is mild cylindrical bronchiectasis. No honeycombing. No acute consolidative airspace disease. No pleural effusions. No suspicious appearing pulmonary nodules or masses are noted. Musculoskeletal: There are no aggressive appearing lytic or blastic lesions noted in the visualized portions of the skeleton. CT ABDOMEN PELVIS FINDINGS Hepatobiliary: Multiple low-attenuation lesions are noted in the liver, incompletely characterized on today's noncontrast CT examination, but favored to represent cysts, largest of which measures 2.1 x 1.4 cm in segment 2 of the liver. Status post cholecystectomy. Pancreas: No definite pancreatic mass or peripancreatic fluid or inflammatory changes are noted on today's noncontrast CT examination. Spleen: Unremarkable. Adrenals/Urinary Tract: 2 mm nonobstructive calculus in the upper pole collecting system of the right kidney. No additional calculi are noted within the collecting system of either kidney, along the course of either ureter, or within the lumen of the urinary  bladder. No hydroureteronephrosis. Mild atrophy of both kidneys. No definite suspicious renal lesions identified on today's noncontrast CT examination. Unenhanced appearance of the urinary bladder is normal. Bilateral adrenal glands are normal in appearance. Stomach/Bowel: Unenhanced appearance of the stomach is normal. Small duodenal diverticulum extending off the medial aspect of the second portion of the duodenum. No surrounding inflammatory changes. No pathologic dilatation of small bowel or colon. Numerous colonic diverticulae are noted, most evident in the sigmoid colon, without surrounding inflammatory changes to suggest an acute diverticulitis at this time. Normal appendix. Vascular/Lymphatic: Aortic atherosclerosis. No lymphadenopathy noted in the abdomen or pelvis. Reproductive: Prostate gland and seminal vesicles are unremarkable in appearance. Other: No significant volume of ascites.  No pneumoperitoneum. Musculoskeletal: There are no aggressive appearing lytic or blastic lesions noted in the visualized portions of the skeleton. IMPRESSION: 1. No acute findings are noted in the abdomen or pelvis to account for the patient's symptoms. 2. Colonic diverticulosis without evidence of acute diverticulitis at this time. 3. 2 mm nonobstructive calculus in the upper pole collecting system of the right kidney. 4. Multiple low-attenuation lesions in the liver, incompletely characterized on today's noncontrast CT examination, but favored to represent cysts. These could be definitively characterized with nonemergent MRI of the abdomen with and without IV gadolinium if there is any clinical concern for metastatic disease to the liver. 5. Aortic atherosclerosis, in addition to left main and 3 vessel coronary artery disease. 6. Findings in the lungs concerning for probable interstitial lung disease. Outpatient referral to Pulmonology for further evaluation is recommended. Additionally, high-resolution chest CT in 12  months is suggested to assess for temporal changes in the appearance of the lung parenchyma. Aortic Atherosclerosis (ICD10-I70.0). Electronically Signed   By: Trudie Reed M.D.   On: 10/19/2017 17:06   Dg Chest Port 1 View  Result Date: 10/19/2017 CLINICAL DATA:  Weakness. EXAM: PORTABLE CHEST 1 VIEW COMPARISON:  None. FINDINGS: The heart size and mediastinal contours are within normal limits. Both lungs are clear. No pneumothorax or pleural effusion is noted. The visualized skeletal structures are unremarkable. IMPRESSION: No acute cardiopulmonary abnormality seen. Electronically Signed   By: Lupita Raider, M.D.   On: 10/19/2017 15:43    EKG:   Orders placed or performed during the hospital encounter of 10/19/17  . EKG 12-Lead  . EKG 12-Lead  ASSESSMENT AND PLAN:   84 y/o male with PMH significant for chronic afib on coumadin, CAD, h/o TIA presents secondary to weakness and sepsis  1. Sepsis- tachycardia and leukocytosis - culturespending, MRSA pcr negative- discontinue vancomycin and flagyl - contnue cefepime - urine cultures pending, could be respiratory as well - no GI symptoms  2. Chronic afib- on verapamil On coumadin for anti coagulation  3. Hypokalemia- replaced  4.HTN- imdur  5. GERD- protonix  6. DVT prophylaxis- on prophylactic doseof lovenox until INR can get therapeutic  7. Elevated troponin- demand ischemia from sepsis  PT consulted for generalized weakness     All the records are reviewed and case discussed with Care Management/Social Workerr. Management plans discussed with the patient, family and they are in agreement.  CODE STATUS: Full Code  TOTAL TIME TAKING CARE OF THIS PATIENT: 33 minutes.   POSSIBLE D/C IN 2 DAYS, DEPENDING ON CLINICAL CONDITION.   Enid Baas M.D on 10/20/2017 at 8:44 PM  Between 7am to 6pm - Pager - [phone]  After 6pm go to [email or web address] - Social research officer, government  [facility]  Office   [phone]  CC: Primary care physician; Shawnee Knapp, MD

## 2017-10-21 LAB — URINE CULTURE: Culture: NO GROWTH

## 2017-10-21 LAB — BASIC METABOLIC PANEL
Anion gap: 11 (ref 5–15)
BUN: 24 mg/dL — AB (ref 8–23)
CALCIUM: 8.1 mg/dL — AB (ref 8.9–10.3)
CHLORIDE: 107 mmol/L (ref 98–111)
CO2: 23 mmol/L (ref 22–32)
CREATININE: 1.49 mg/dL — AB (ref 0.61–1.24)
GFR calc non Af Amer: 42 mL/min — ABNORMAL LOW (ref >60)
GFR, EST AFRICAN AMERICAN: 49 mL/min — AB (ref >60)
GLUCOSE: 161 mg/dL — AB (ref 70–99)
Potassium: 3.7 mmol/L (ref 3.5–5.1)
Sodium: 141 mmol/L (ref 135–145)

## 2017-10-21 LAB — CBC
HCT: 31.2 % — ABNORMAL LOW (ref 40.0–52.0)
HEMOGLOBIN: 10.6 g/dL — AB (ref 13.0–18.0)
MCH: 32.3 pg (ref 26.0–34.0)
MCHC: 34 g/dL (ref 32.0–36.0)
MCV: 95 fL (ref 80.0–100.0)
Platelets: 175 10*3/uL (ref 150–440)
RBC: 3.28 MIL/uL — ABNORMAL LOW (ref 4.40–5.90)
RDW: 14.1 % (ref 11.5–14.5)
WBC: 15.4 10*3/uL — ABNORMAL HIGH (ref 3.8–10.6)

## 2017-10-21 LAB — PROTIME-INR
INR: 1.44
Prothrombin Time: 17.4 seconds — ABNORMAL HIGH (ref 11.4–15.2)

## 2017-10-21 LAB — MAGNESIUM: Magnesium: 1.9 mg/dL (ref 1.7–2.4)

## 2017-10-21 MED ORDER — TRAMADOL HCL 50 MG PO TABS
50.0000 mg | ORAL_TABLET | Freq: Four times a day (QID) | ORAL | Status: DC | PRN
  Administered 2017-10-21: 50 mg via ORAL
  Filled 2017-10-21: qty 1

## 2017-10-21 MED ORDER — ACETAMINOPHEN 325 MG PO TABS
650.0000 mg | ORAL_TABLET | Freq: Four times a day (QID) | ORAL | Status: DC | PRN
  Administered 2017-10-22 – 2017-10-25 (×6): 650 mg via ORAL
  Filled 2017-10-21 (×6): qty 2

## 2017-10-21 MED ORDER — WARFARIN SODIUM 2.5 MG PO TABS
2.5000 mg | ORAL_TABLET | Freq: Once | ORAL | Status: AC
  Administered 2017-10-21: 17:00:00 2.5 mg via ORAL
  Filled 2017-10-21: qty 1

## 2017-10-21 NOTE — Progress Notes (Signed)
Dr Nemiah Commander notified via text that patient had 3 beat run of v tach.

## 2017-10-21 NOTE — Clinical Social Work Note (Signed)
Clinical Social Work Assessment  Patient Details  Name: Jeremiah James MRN: [id number] Date of Birth: 1933-03-30  Date of referral:  10/21/17               Reason for consult:  Facility Placement                Permission sought to share information with:  Case Manager, Customer service manager, Family Supports Permission granted to share information::  Yes, Verbal Permission Granted  Name::      SNF  Agency::   [facility]  Relationship::     Contact Information:     Housing/Transportation Living arrangements for the past 2 months:  Point of Information:  Patient Patient Interpreter Needed:  None Criminal Activity/Legal Involvement Pertinent to Current Situation/Hospitalization:  No - Comment as needed Significant Relationships:  Adult Children Lives with:  Self Do you feel safe going back to the place where you live?  Yes Need for family participation in patient care:  Yes (Comment)  Care giving concerns:  Patient lives alone in [place] Worker assessment / plan:  CSW consulted for SNF placement. CSW met with patient. CSW introduced self and explained role. Patient states that he lives alone and does not feel that he can go home yet. CSW explained PT recommendation of SNF and patient is in agreement. Patient states he does not have a preference and asked CSW to call his daughter. CSW contacted patient's daughter Wilber Oliphant [phone]. Daughter states that patient lives in an independent living community for seniors. Daughter is also in agreement with SNF placement and is interested in [facility]. CSW will initiate bed search and will give bed offers when available. CSW will follow for discharge planning.   Employment status:  Retired Nurse, adult PT Recommendations:  [place] / Referral to community resources:  [place]  Patient/Family's Response to care:   Patient and daughter thanked CSW for assistance  Patient/Family's Understanding of and Emotional Response to Diagnosis, Current Treatment, and Prognosis:  Patient and daughter are in agreement with discharge plan   Emotional Assessment Appearance:  Appears stated age Attitude/Demeanor/Rapport:    Affect (typically observed):  Quiet, Adaptable Orientation:  Oriented to Self, Oriented to Place, Oriented to  Time Alcohol / Substance use:  Not Applicable Psych involvement (Current and /or in the community):  No (Comment)  Discharge Needs  Concerns to be addressed:  Discharge Planning Concerns Readmission within the last 30 days:  No Current discharge risk:  Lives alone, Dependent with Mobility Barriers to Discharge:  Continued Medical Work up   [employer], [place] 10/21/2017, 10:49 AM

## 2017-10-21 NOTE — [place] FL2 (Signed)
[place] MEDICAID FL2 LEVEL OF CARE SCREENING TOOL     IDENTIFICATION  Patient Name: Jeremiah James Birthdate: [birth year] Sex: male Admission Date (Current Location): 10/19/2017  [place] and [place] Number:  Chiropodist and Address:  [facility], [address], [place], [place] [address]      Provider Number: [id number]  Attending Physician Name and Address:  Enid Baas, MD  Relative Name and Phone Number:  Julian Hy- daughter [phone]    Current Level of Care: Hospital Recommended Level of Care: Skilled Nursing Facility Prior Approval Number:    Date Approved/Denied:   PASRR Number: [id number]  Discharge Plan: SNF    Current Diagnoses: Patient Active Problem List   Diagnosis Date Noted  . Sepsis (HCC) 10/19/2017  . Transient ischemic attack 12/03/2012  . S/P coronary artery stent placement 12/03/2012  . Atrial fibrillation (HCC) 06/14/2012  . 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 Other dyspnea and respiratory abnormality 06/14/2012  . Coronary artery disease involving native coronary artery of native heart without angina pectoris 05/05/2012  . Intermediate coronary syndrome (HCC) 05/05/2012  . Family history of ischemic heart disease (IHD) 11/26/2010    Orientation RESPIRATION BLADDER Height & Weight     Self, Time, Place  Normal Incontinent Weight: 226 lb (102.5 kg) Height:  5\' 11"  (180.3 cm)  BEHAVIORAL SYMPTOMS/MOOD NEUROLOGICAL BOWEL NUTRITION STATUS  (none) (none) Continent Diet(Heart Healthy )  AMBULATORY STATUS COMMUNICATION OF NEEDS Skin   Extensive Assist Verbally Normal                       Personal Care Assistance Level of Assistance  Bathing, Feeding, Dressing Bathing Assistance: Limited assistance Feeding assistance: Independent Dressing Assistance: Limited assistance     Functional Limitations Info  Sight, Hearing, Speech Sight Info: Adequate Hearing  Info: Adequate Speech Info: Adequate    SPECIAL CARE FACTORS FREQUENCY  PT (By licensed PT), OT (By licensed OT)     PT Frequency: 5 OT Frequency: 5            Contractures Contractures Info: Not present    Additional Factors Info  Code Status, Allergies Code Status Info: Full Code  Allergies Info:  Morphine, Hydrocodone-acetaminophen           Current Medications (10/21/2017):  This is the current hospital active medication list Current Facility-Administered Medications  Medication Dose Route Frequency Provider Last Rate Last Dose  . acetaminophen (TYLENOL) tablet 325 mg  325 mg Oral Q6H PRN Ramonita Lab, MD   325 mg at 10/21/17 7253  . atorvastatin (LIPITOR) tablet 40 mg  40 mg Oral Daily Gouru, Aruna, MD   40 mg at 10/20/17 1735  . benzonatate (TESSALON) capsule 100 mg  100 mg Oral TID Enid Baas, MD   100 mg at 10/20/17 2128  . ceFEPIme (MAXIPIME) 2 g in sodium chloride 0.9 % 100 mL IVPB  2 g Intravenous Q12H Gouru, Aruna, MD 200 mL/hr at 10/21/17 0533 2 g at 10/21/17 0533  . chlorpheniramine-HYDROcodone (TUSSIONEX) 10-8 MG/5ML suspension 5 mL  5 mL Oral Q12H PRN Enid Baas, MD      . docusate sodium (COLACE) capsule 100 mg  100 mg Oral BID Gouru, Aruna, MD   100 mg at 10/20/17 2128  . enoxaparin (LOVENOX) injection 40 mg  40 mg Subcutaneous Q24H Gouru, Aruna, MD   40 mg at 10/20/17 2128  . isosorbide mononitrate (IMDUR) 24 hr tablet 30 mg  30 mg Oral Daily Gouru, Aruna, MD   30 mg at 10/20/17 0915  . nitroGLYCERIN (NITROSTAT) SL tablet 0.4 mg  0.4 mg Sublingual Q5 min PRN Gouru, Aruna, MD      . ondansetron (ZOFRAN) tablet 4 mg  4 mg Oral Q6H PRN Gouru, Aruna, MD       Or  . ondansetron (ZOFRAN) injection 4 mg  4 mg Intravenous Q6H PRN Gouru, Aruna, MD      . pantoprazole (PROTONIX) EC tablet 40 mg  40 mg Oral Daily Gouru, Aruna, MD   40 mg at 10/20/17 0915  . tamsulosin (FLOMAX) capsule 0.4 mg  0.4 mg Oral BID Gouru, Aruna, MD   0.4 mg at 10/20/17 2128   . verapamil (CALAN) tablet 80 mg  80 mg Oral BID Ramonita Lab, MD   80 mg at 10/20/17 2128  . warfarin (COUMADIN) tablet 2.5 mg  2.5 mg Oral ONCE-1800 Enid Baas, MD      . Warfarin - Pharmacist Dosing Inpatient   Does not apply [id number] Ramonita Lab, MD         Discharge Medications: Please see discharge summary for a list of discharge medications.  Relevant Imaging Results:  Relevant Lab Results:   Additional Information SSN: [id number]  Ruthe Mannan, [place]

## 2017-10-21 NOTE — Plan of Care (Signed)

## 2017-10-21 NOTE — Progress Notes (Signed)
PT Cancellation Note  Patient Details Name: Jeremiah James MRN: [id number] DOB: 11/19/1933   Cancelled Treatment:    Reason Eval/Treat Not Completed: Patient declined, no reason specified   Pt offered and encouraged session this am.  He declined stating he was unable to do anything because of weakness.  He denied changes from yesterday. Reviewed that he was able to get up to recliner yesterday but he shook his head no and stated "I can't today."  Encouraged bedside exercises but he continued to decline.    Danielle Dess 10/21/2017, 9:38 AM

## 2017-10-21 NOTE — Clinical Social Work Placement (Signed)
   CLINICAL SOCIAL WORK PLACEMENT  NOTE  Date:  10/21/2017  Patient Details  Name: Jeremiah James MRN: [id number] Date of Birth: Feb 26, 1933  Clinical Social Work is seeking post-discharge placement for this patient at the Skilled  Nursing Facility level of care (*CSW will initial, date and re-position this form in  chart as items are completed):  Yes   Patient/family provided with [facility] Clinical Social Work Department's list of facilities offering this level of care within the geographic area requested by the patient (or if unable, by the patient's family).  Yes   Patient/family informed of their freedom to choose among providers that offer the needed level of care, that participate in Medicare, Medicaid or managed care program needed by the patient, have an available bed and are willing to accept the patient.  Yes   Patient/family informed of [facility]'s ownership interest in [facility] and [facility], as well as of the fact that they are under no obligation to receive care at these facilities.  PASRR submitted to EDS on 10/21/17     PASRR number received on 10/21/17     Existing PASRR number confirmed on       FL2 transmitted to all facilities in geographic area requested by pt/family on 10/21/17     FL2 transmitted to all facilities within larger geographic area on       Patient informed that his/her managed care company has contracts with or will negotiate with certain facilities, including the following:            Patient/family informed of bed offers received.  Patient chooses bed at       Physician recommends and patient chooses bed at      Patient to be transferred to   on  .  Patient to be transferred to facility by       Patient family notified on   of transfer.  Name of family member notified:        PHYSICIAN       Additional Comment:    _______________________________________________ Ruthe Mannan, LCSWA 10/21/2017, 11:00 AM

## 2017-10-21 NOTE — Progress Notes (Addendum)
ANTICOAGULATION CONSULT NOTE -  Pharmacy Consult for Warfarin  Indication: atrial fibrillation  Allergies  Allergen Reactions  . Morphine Rash    unknown  . Hydrocodone-Acetaminophen Rash    agitation    Patient Measurements: Height: 5\' 11"  (180.3 cm) Weight: 226 lb (102.5 kg) IBW/kg (Calculated) : 75.3 Heparin Dosing Weight:   Vital Signs: Temp: 98.7 F (37.1 C) (09/26 0343) BP: 124/56 (09/26 0343) Pulse Rate: 99 (09/26 0343)  Labs: Recent Labs    10/19/17 1537 10/19/17 2143 10/20/17 0341 10/20/17 0837 10/21/17 0331 10/21/17 0750  HGB 12.8*  --   --  11.0* 10.6*  --   HCT 36.7*  --   --  32.7* 31.2*  --   PLT 173  --   --  165 175  --   APTT 26  --   --   --   --   --   LABPROT 17.1*  --   --  16.4*  --  17.4*  INR 1.41  --   --  1.33  --  1.44  CREATININE 1.56*  --   --  1.29* 1.49*  --   TROPONINI 0.04* 0.05* 0.06* 0.06*  --   --     Estimated Creatinine Clearance: 46.6 mL/min (A) (by C-G formula based on SCr of 1.49 mg/dL (H)).   Medical History: Past Medical History:  Diagnosis Date  . A-fib (HCC)   . MI (myocardial infarction) (HCC)    x6    Medications:  Medications Prior to Admission  Medication Sig Dispense Refill Last Dose  . allopurinol (ZYLOPRIM) 100 MG tablet Take 1 tablet by mouth daily.   10/19/2017 at Unknown time  . atorvastatin (LIPITOR) 40 MG tablet Take 40 mg by mouth daily.   10/19/2017 at Unknown time  . hydrochlorothiazide (HYDRODIURIL) 25 MG tablet Take 1 tablet by mouth daily.   10/19/2017 at Unknown time  . isosorbide mononitrate (IMDUR) 30 MG 24 hr tablet Take 1 tablet by mouth daily.   10/19/2017 at Unknown time  . nitroGLYCERIN (NITROSTAT) 0.4 MG SL tablet Place 0.4 mg under the tongue every 5 (five) minutes as needed for chest pain.   PRN at PRN  . pantoprazole (PROTONIX) 40 MG tablet Take 1 tablet by mouth daily.   10/19/2017 at Unknown time  . potassium chloride SA (K-DUR,KLOR-CON) 20 MEQ tablet Take 20 mEq by mouth 2 (two)  times daily.   10/19/2017 at Unknown time  . tamsulosin (FLOMAX) 0.4 MG CAPS capsule Take 1 capsule by mouth 2 (two) times daily.   10/19/2017 at Unknown time  . verapamil (CALAN) 80 MG tablet Take 80 mg by mouth 2 (two) times daily.   10/19/2017 at Unknown time  . warfarin (COUMADIN) 2.5 MG tablet Take 1.25 mg by mouth at bedtime.   10/18/2017 at Unknown time  . acetaminophen (TYLENOL) 325 MG tablet Take 1 tablet by mouth 3 (three) times daily as needed.   PRN at PRN    Assessment: 84 yom cc weakness with PMH AF on VKA, MI x 6, history of TIA. Sepsis (of unknown source) protocol was initiated due to leukocytosis and tachycardia. 9/25 PCT 0.24 and CXR had no noted abnormality. Patient also noted to have AKI and is being rehydrated.   Home Warfarin dose= 1.25 mg daily  WARFARIN COURSE DATE INR DOSE  9/24 1.41 2 mg 9/25 1.33 2 mg 9/26 1.44  Goal of Therapy:  INR 2-3   Plan:  INR still subtherapeutic today. Patient's albumin  is low, 2.6 g/L. Due to potential interactions with disease states (sepsis, hypoalbuminiemia) and antibiotics we will give warfarin 2.5 mg po x1 and monitor INR daily until INR therapeutic x 2.  On lovenox.  Markevion Lattin A, Pharm.D., BCPS Clinical Pharmacist 10/21/2017,8:28 AM

## 2017-10-21 NOTE — Progress Notes (Signed)
[facility] [place] [facility]   PATIENT NAME: Jeremiah James    MR#:  [id number]  DATE OF BIRTH:  October 16, 1933  SUBJECTIVE:  CHIEF COMPLAINT:   Chief Complaint  Patient presents with  . Weakness   -Cough is improved.  Complains of significant malaise and weakness. -Physical therapy recommended rehab  REVIEW OF SYSTEMS:  Review of Systems  Constitutional: Positive for malaise/fatigue. Negative for chills and fever.  HENT: Negative for congestion, ear discharge, hearing loss and nosebleeds.   Eyes: Negative for blurred vision and double vision.  Respiratory: Positive for cough and shortness of breath. Negative for wheezing.   Cardiovascular: Negative for chest pain and palpitations.  Gastrointestinal: Negative for abdominal pain, constipation, diarrhea, nausea and vomiting.  Genitourinary: Negative for dysuria.  Musculoskeletal: Negative for myalgias.  Neurological: Negative for dizziness, sensory change, speech change, focal weakness, seizures and headaches.  Psychiatric/Behavioral: Negative for depression.    DRUG ALLERGIES:   Allergies  Allergen Reactions  . Morphine Rash    unknown  . Hydrocodone-Acetaminophen Rash    agitation    VITALS:  Blood pressure 137/79, pulse 85, temperature 99.1 F (37.3 C), temperature source Oral, resp. rate 20, height 5\' 11"  (1.803 m), weight 102.5 kg, SpO2 95 %.  PHYSICAL EXAMINATION:  Physical Exam  GENERAL:  84 y.o.-year-old patient lying in the bed with no acute distress.  EYES: Pupils equal, round, reactive to light and accommodation. No scleral icterus. Extraocular muscles intact.  HEENT: Head atraumatic, normocephalic. Oropharynx and nasopharynx clear.  NECK:  Supple, no jugular venous distention. No thyroid enlargement, no tenderness.  LUNGS: Normal breath sounds bilaterally, no wheezing, rhonchi or crepitation. Fine crackles at left base.No use of accessory muscles of respiration.  CARDIOVASCULAR: S1,  S2 normal. No  rubs, or gallops. 3/6 systolic murmur present. ABDOMEN: Soft, nontender, nondistended. Bowel sounds present. No organomegaly or mass.  EXTREMITIES: No pedal edema, cyanosis, or clubbing.  NEUROLOGIC: Cranial nerves II through XII are intact. Muscle strength 5/5 in all extremities. Sensation intact. Gait not checked. Global weakness noted. PSYCHIATRIC: The patient is alert and oriented x 3.  SKIN: No obvious rash, lesion, or ulcer.    LABORATORY PANEL:   CBC Recent Labs  Lab 10/21/17 0331  WBC 15.4*  HGB 10.6*  HCT 31.2*  PLT 175   ------------------------------------------------------------------------------------------------------------------  Chemistries  Recent Labs  Lab 10/20/17 0837 10/21/17 0331  NA 141 141  K 3.0* 3.7  CL 108 107  CO2 24 23  GLUCOSE 110* 161*  BUN 18 24*  CREATININE 1.29* 1.49*  CALCIUM 8.1* 8.1*  MG 1.5* 1.9  AST 170*  --   ALT 30  --   ALKPHOS 37*  --   BILITOT 1.5*  --    ------------------------------------------------------------------------------------------------------------------  Cardiac Enzymes Recent Labs  Lab 10/20/17 0837  TROPONINI 0.06*   ------------------------------------------------------------------------------------------------------------------  RADIOLOGY:  Ct Abdomen Pelvis Wo Contrast  Result Date: 10/19/2017 CLINICAL DATA:  84 year old male with history of worsening weakness for the past 3-4 days. Nausea and vomiting. EXAM: CT CHEST, ABDOMEN AND PELVIS WITHOUT CONTRAST TECHNIQUE: Multidetector CT imaging of the chest, abdomen and pelvis was performed following the standard protocol without IV contrast. COMPARISON:  None. FINDINGS: CT CHEST FINDINGS Cardiovascular: Heart size is normal. There is no significant pericardial fluid, thickening or pericardial calcification. There is aortic atherosclerosis, as well as atherosclerosis of the great vessels of the mediastinum and the coronary arteries,  including calcified atherosclerotic plaque in the left main, left anterior descending,  left circumflex and right coronary arteries. Mediastinum/Nodes: No pathologically enlarged mediastinal or hilar lymph nodes. Please note that accurate exclusion of hilar adenopathy is limited on noncontrast CT scans. Esophagus is unremarkable in appearance. No axillary lymphadenopathy. Lungs/Pleura: Patchy areas of peripheral predominant ground-glass attenuation and septal thickening are noted throughout the mid to lower lungs bilaterally. Throughout the same distribution there is mild cylindrical bronchiectasis. No honeycombing. No acute consolidative airspace disease. No pleural effusions. No suspicious appearing pulmonary nodules or masses are noted. Musculoskeletal: There are no aggressive appearing lytic or blastic lesions noted in the visualized portions of the skeleton. CT ABDOMEN PELVIS FINDINGS Hepatobiliary: Multiple low-attenuation lesions are noted in the liver, incompletely characterized on today's noncontrast CT examination, but favored to represent cysts, largest of which measures 2.1 x 1.4 cm in segment 2 of the liver. Status post cholecystectomy. Pancreas: No definite pancreatic mass or peripancreatic fluid or inflammatory changes are noted on today's noncontrast CT examination. Spleen: Unremarkable. Adrenals/Urinary Tract: 2 mm nonobstructive calculus in the upper pole collecting system of the right kidney. No additional calculi are noted within the collecting system of either kidney, along the course of either ureter, or within the lumen of the urinary bladder. No hydroureteronephrosis. Mild atrophy of both kidneys. No definite suspicious renal lesions identified on today's noncontrast CT examination. Unenhanced appearance of the urinary bladder is normal. Bilateral adrenal glands are normal in appearance. Stomach/Bowel: Unenhanced appearance of the stomach is normal. Small duodenal diverticulum extending off  the medial aspect of the second portion of the duodenum. No surrounding inflammatory changes. No pathologic dilatation of small bowel or colon. Numerous colonic diverticulae are noted, most evident in the sigmoid colon, without surrounding inflammatory changes to suggest an acute diverticulitis at this time. Normal appendix. Vascular/Lymphatic: Aortic atherosclerosis. No lymphadenopathy noted in the abdomen or pelvis. Reproductive: Prostate gland and seminal vesicles are unremarkable in appearance. Other: No significant volume of ascites.  No pneumoperitoneum. Musculoskeletal: There are no aggressive appearing lytic or blastic lesions noted in the visualized portions of the skeleton. IMPRESSION: 1. No acute findings are noted in the abdomen or pelvis to account for the patient's symptoms. 2. Colonic diverticulosis without evidence of acute diverticulitis at this time. 3. 2 mm nonobstructive calculus in the upper pole collecting system of the right kidney. 4. Multiple low-attenuation lesions in the liver, incompletely characterized on today's noncontrast CT examination, but favored to represent cysts. These could be definitively characterized with nonemergent MRI of the abdomen with and without IV gadolinium if there is any clinical concern for metastatic disease to the liver. 5. Aortic atherosclerosis, in addition to left main and 3 vessel coronary artery disease. 6. Findings in the lungs concerning for probable interstitial lung disease. Outpatient referral to Pulmonology for further evaluation is recommended. Additionally, high-resolution chest CT in 12 months is suggested to assess for temporal changes in the appearance of the lung parenchyma. Aortic Atherosclerosis (ICD10-I70.0). Electronically Signed   By: Trudie Reed M.D.   On: 10/19/2017 17:06   Ct Chest Wo Contrast  Result Date: 10/19/2017 CLINICAL DATA:  84 year old male with history of worsening weakness for the past 3-4 days. Nausea and vomiting.  EXAM: CT CHEST, ABDOMEN AND PELVIS WITHOUT CONTRAST TECHNIQUE: Multidetector CT imaging of the chest, abdomen and pelvis was performed following the standard protocol without IV contrast. COMPARISON:  None. FINDINGS: CT CHEST FINDINGS Cardiovascular: Heart size is normal. There is no significant pericardial fluid, thickening or pericardial calcification. There is aortic atherosclerosis, as well as atherosclerosis of the  great vessels of the mediastinum and the coronary arteries, including calcified atherosclerotic plaque in the left main, left anterior descending, left circumflex and right coronary arteries. Mediastinum/Nodes: No pathologically enlarged mediastinal or hilar lymph nodes. Please note that accurate exclusion of hilar adenopathy is limited on noncontrast CT scans. Esophagus is unremarkable in appearance. No axillary lymphadenopathy. Lungs/Pleura: Patchy areas of peripheral predominant ground-glass attenuation and septal thickening are noted throughout the mid to lower lungs bilaterally. Throughout the same distribution there is mild cylindrical bronchiectasis. No honeycombing. No acute consolidative airspace disease. No pleural effusions. No suspicious appearing pulmonary nodules or masses are noted. Musculoskeletal: There are no aggressive appearing lytic or blastic lesions noted in the visualized portions of the skeleton. CT ABDOMEN PELVIS FINDINGS Hepatobiliary: Multiple low-attenuation lesions are noted in the liver, incompletely characterized on today's noncontrast CT examination, but favored to represent cysts, largest of which measures 2.1 x 1.4 cm in segment 2 of the liver. Status post cholecystectomy. Pancreas: No definite pancreatic mass or peripancreatic fluid or inflammatory changes are noted on today's noncontrast CT examination. Spleen: Unremarkable. Adrenals/Urinary Tract: 2 mm nonobstructive calculus in the upper pole collecting system of the right kidney. No additional calculi are noted  within the collecting system of either kidney, along the course of either ureter, or within the lumen of the urinary bladder. No hydroureteronephrosis. Mild atrophy of both kidneys. No definite suspicious renal lesions identified on today's noncontrast CT examination. Unenhanced appearance of the urinary bladder is normal. Bilateral adrenal glands are normal in appearance. Stomach/Bowel: Unenhanced appearance of the stomach is normal. Small duodenal diverticulum extending off the medial aspect of the second portion of the duodenum. No surrounding inflammatory changes. No pathologic dilatation of small bowel or colon. Numerous colonic diverticulae are noted, most evident in the sigmoid colon, without surrounding inflammatory changes to suggest an acute diverticulitis at this time. Normal appendix. Vascular/Lymphatic: Aortic atherosclerosis. No lymphadenopathy noted in the abdomen or pelvis. Reproductive: Prostate gland and seminal vesicles are unremarkable in appearance. Other: No significant volume of ascites.  No pneumoperitoneum. Musculoskeletal: There are no aggressive appearing lytic or blastic lesions noted in the visualized portions of the skeleton. IMPRESSION: 1. No acute findings are noted in the abdomen or pelvis to account for the patient's symptoms. 2. Colonic diverticulosis without evidence of acute diverticulitis at this time. 3. 2 mm nonobstructive calculus in the upper pole collecting system of the right kidney. 4. Multiple low-attenuation lesions in the liver, incompletely characterized on today's noncontrast CT examination, but favored to represent cysts. These could be definitively characterized with nonemergent MRI of the abdomen with and without IV gadolinium if there is any clinical concern for metastatic disease to the liver. 5. Aortic atherosclerosis, in addition to left main and 3 vessel coronary artery disease. 6. Findings in the lungs concerning for probable interstitial lung disease.  Outpatient referral to Pulmonology for further evaluation is recommended. Additionally, high-resolution chest CT in 12 months is suggested to assess for temporal changes in the appearance of the lung parenchyma. Aortic Atherosclerosis (ICD10-I70.0). Electronically Signed   By: Trudie Reed M.D.   On: 10/19/2017 17:06   Dg Chest Port 1 View  Result Date: 10/19/2017 CLINICAL DATA:  Weakness. EXAM: PORTABLE CHEST 1 VIEW COMPARISON:  None. FINDINGS: The heart size and mediastinal contours are within normal limits. Both lungs are clear. No pneumothorax or pleural effusion is noted. The visualized skeletal structures are unremarkable. IMPRESSION: No acute cardiopulmonary abnormality seen. Electronically Signed   By: Lupita Raider, M.D.  On: 10/19/2017 15:43    EKG:   Orders placed or performed during the hospital encounter of 10/19/17  . EKG 12-Lead  . EKG 12-Lead    ASSESSMENT AND PLAN:   84 y/o male with PMH significant for chronic afib on coumadin, CAD, h/o TIA presents secondary to weakness and sepsis  1. Sepsis- tachycardia and leukocytosis - cultures pending, MRSA pcr negative-  - contnue cefepime - urine cultures pending, could be respiratory as well - no GI symptoms  2. Chronic afib- on verapamil On coumadin for anti coagulation  3. Hypokalemia- replaced  4.HTN- imdur  5. GERD- protonix  6. DVT prophylaxis- on prophylactic doseof lovenox until INR can get therapeutic  7. Elevated troponin- demand ischemia from sepsis  PT consulted for generalized weakness-recommended rehab. -Updated daughter over the phone     All the records are reviewed and case discussed with Care Management/Social Workerr. Management plans discussed with the patient, family and they are in agreement.  CODE STATUS: Full Code  TOTAL TIME TAKING CARE OF THIS PATIENT: 36 minutes.   POSSIBLE D/C IN 1-2 DAYS, DEPENDING ON CLINICAL CONDITION.   Enid Baas M.D on 10/21/2017 at 1:57  PM  Between 7am to 6pm - Pager - [phone]  After 6pm go to [email or web address] - Social research officer, government  [facility]  Office  [phone]  CC: Primary care physician; Shawnee Knapp, MD

## 2017-10-22 ENCOUNTER — Inpatient Hospital Stay: Payer: Medicare HMO

## 2017-10-22 LAB — BASIC METABOLIC PANEL
Anion gap: 11 (ref 5–15)
BUN: 19 mg/dL (ref 8–23)
CO2: 24 mmol/L (ref 22–32)
Calcium: 8.2 mg/dL — ABNORMAL LOW (ref 8.9–10.3)
Chloride: 105 mmol/L (ref 98–111)
Creatinine, Ser: 1.15 mg/dL (ref 0.61–1.24)
GFR calc Af Amer: >60 mL/min (ref >60)
GFR, EST NON AFRICAN AMERICAN: 57 mL/min — AB (ref >60)
GLUCOSE: 114 mg/dL — AB (ref 70–99)
POTASSIUM: 3.4 mmol/L — AB (ref 3.5–5.1)
Sodium: 140 mmol/L (ref 135–145)

## 2017-10-22 LAB — CBC
HEMATOCRIT: 30.2 % — AB (ref 40.0–52.0)
Hemoglobin: 11 g/dL — ABNORMAL LOW (ref 13.0–18.0)
MCH: 39.2 pg — AB (ref 26.0–34.0)
MCHC: 36.4 g/dL — AB (ref 32.0–36.0)
MCV: 107.7 fL — AB (ref 80.0–100.0)
Platelets: 196 10*3/uL (ref 150–440)
RBC: 2.8 MIL/uL — ABNORMAL LOW (ref 4.40–5.90)
RDW: 14.4 % (ref 11.5–14.5)
WBC: 11.6 10*3/uL — ABNORMAL HIGH (ref 3.8–10.6)

## 2017-10-22 LAB — PROTIME-INR
INR: 1.48
Prothrombin Time: 17.8 seconds — ABNORMAL HIGH (ref 11.4–15.2)

## 2017-10-22 LAB — VANCOMYCIN, TROUGH: Vancomycin Tr: 5 ug/mL — ABNORMAL LOW (ref 15–20)

## 2017-10-22 LAB — URIC ACID: Uric Acid, Serum: 5.5 mg/dL (ref 3.7–8.6)

## 2017-10-22 LAB — CK: Total CK: 602 U/L — ABNORMAL HIGH (ref 49–397)

## 2017-10-22 MED ORDER — SODIUM CHLORIDE 0.9 % IV SOLN
INTRAVENOUS | Status: DC
  Administered 2017-10-22 – 2017-10-23 (×2): via INTRAVENOUS

## 2017-10-22 MED ORDER — WARFARIN SODIUM 2.5 MG PO TABS
2.5000 mg | ORAL_TABLET | Freq: Every day | ORAL | Status: DC
  Administered 2017-10-22 – 2017-10-23 (×2): 2.5 mg via ORAL
  Filled 2017-10-22 (×2): qty 1

## 2017-10-22 MED ORDER — AMOXICILLIN-POT CLAVULANATE 875-125 MG PO TABS
1.0000 | ORAL_TABLET | Freq: Two times a day (BID) | ORAL | Status: DC
  Administered 2017-10-22 – 2017-10-25 (×6): 1 via ORAL
  Filled 2017-10-22 (×6): qty 1

## 2017-10-22 MED ORDER — POTASSIUM CHLORIDE CRYS ER 20 MEQ PO TBCR
40.0000 meq | EXTENDED_RELEASE_TABLET | Freq: Once | ORAL | Status: AC
  Administered 2017-10-22: 17:00:00 40 meq via ORAL
  Filled 2017-10-22: qty 2

## 2017-10-22 NOTE — Care Management Important Message (Signed)
Important Message  Patient Details  Name: Jeremiah James MRN: [id number] Date of Birth: [birth year]   Medicare Important Message Given:  Yes    Olegario Messier A Everard Interrante 10/22/2017, 11:50 AM

## 2017-10-22 NOTE — Progress Notes (Signed)
Pts daughter concerned that pt not on IV fluids, CK 602, Dr Luberta Mutter made aware, new order for NS at 39ml/hr

## 2017-10-22 NOTE — Clinical Social Work Note (Signed)
CSW spoke with patient's daughter Julian Hy [phone] regarding bed offers. Daughter accepted bed at [facility]. CSW notified Verlon Au at [facility] of bed acceptance. Verlon Au will begin [facility]. CSW will continue to follow for discharge planning.   Ruthe Mannan MSW, [address]  [phone]

## 2017-10-22 NOTE — Progress Notes (Signed)
Physical Therapy Treatment Patient Details Name: Jeremiah James MRN: [id number] DOB: [birth year] Today's Date: 10/22/2017    History of Present Illness Pt is an 84 y.o. male with a known history of chronic atrial fibrillation on Coumadin, coronary artery disease status post MI, history of TIA with generalized weakness presented to the ED with worsening weakness.  Patient was unable to get out of the chair and he fell because he was too weak to walk.  Patient was tachycardic, sepsis protocol implemented cultures were done and patient was started on empiric IV antibiotics hospitalist team is called to admit the patient.  Assessment includes: Sepsis of unclear etiology, AKI, A-fib with RVR, CAD, liver lesions, and interstitial lung disease.      PT Comments    Patient pleasant and agreeable to session this date, willing to 'give it 100%'.  However, significantly limited by pain in bilat feet.  RN/MD informed/aware; no additional medications available at this time (family preferring tylenol only per RN). Requiring use of scoot pivot transfer, max assist +2, to complete bed/chair transfer this date due to limited ability to actively assist/accept weight through bilat feet with transfers and mobility. Unsafe/unable to attempt additional mobility or gait at this time.     Follow Up Recommendations  SNF     Equipment Recommendations       Recommendations for Other Services       Precautions / Restrictions Precautions Precautions: Fall Restrictions Weight Bearing Restrictions: No    Mobility  Bed Mobility Overal bed mobility: Needs Assistance Bed Mobility: Supine to Sit     Supine to sit: Mod assist     General bed mobility comments: assist for LE management, truncal elevation and initial positioning in midline  Transfers Overall transfer level: Needs assistance Equipment used: Rolling walker (2 wheeled) Transfers: Sit to/from Stand Sit to Stand: Max assist;+2 physical assistance          General transfer comment: unable to achieve full, erect stance x3 repetitions despite elevated bed surface, extensive assist; limited by pain in bilat feet  Ambulation/Gait             General Gait Details: unsafe/unable due to pain   Stairs             Wheelchair Mobility    Modified Rankin (Stroke Patients Only)       Balance Overall balance assessment: Needs assistance Sitting-balance support: No upper extremity supported;Feet supported Sitting balance-Leahy Scale: Fair     Standing balance support: Bilateral upper extremity supported Standing balance-Leahy Scale: Zero                              Cognition Arousal/Alertness: Awake/alert Behavior During Therapy: WFL for tasks assessed/performed Overall Cognitive Status: Within Functional Limits for tasks assessed                                        Exercises Other Exercises Other Exercises: Palpation to bilat feet reveal diffuse tenderness over dorsal surface (full width) of bilat feet.  No specific point tenderness identified; no crepitus palpable.  Very poor tolerance for ROM or any WBing bilat LEs. Other Exercises: Scoot pivot transfer, bed/chair, over level surfaces, max assist +2 for lift off, lateral movement.  Continues with limited ability to actively assist due to LE/foot pain.    General Comments  Pertinent Vitals/Pain Pain Assessment: Faces Faces Pain Scale: Hurts whole lot Pain Location: bilat feet Pain Descriptors / Indicators: Aching;Grimacing;Guarding Pain Intervention(s): Limited activity within patient's tolerance;Monitored during session;Repositioned;Patient requesting pain meds-RN notified(RN informed/aware, reports patient family requesting patient have only tylenol for pain)    Home Living                      Prior Function            PT Goals (current goals can now be found in the care plan section) Acute Rehab PT  Goals Patient Stated Goal: To get stronger PT Goal Formulation: With patient Time For Goal Achievement: 11/02/17 Potential to Achieve Goals: Good Progress towards PT goals: Not progressing toward goals - comment(limited by pain in bilat feet)    Frequency    Min 2X/week      PT Plan Current plan remains appropriate    Co-evaluation              AM-PAC PT "6 Clicks" Daily Activity  Outcome Measure  Difficulty turning over in bed (including adjusting bedclothes, sheets and blankets)?: Unable Difficulty moving from lying on back to sitting on the side of the bed? : Unable Difficulty sitting down on and standing up from a chair with arms (e.g., wheelchair, bedside commode, etc,.)?: Unable Help needed moving to and from a bed to chair (including a wheelchair)?: Total Help needed walking in hospital room?: Total Help needed climbing 3-5 steps with a railing? : Total 6 Click Score: 6    End of Session Equipment Utilized During Treatment: Gait belt Activity Tolerance: Patient limited by fatigue Patient left: in chair;with call bell/phone within reach;with chair alarm set Nurse Communication: Mobility status PT Visit Diagnosis: Muscle weakness (generalized) (M62.81);Difficulty in walking, not elsewhere classified (R26.2);History of falling (Z91.81)     Time: 1008-1040 PT Time Calculation (min) (ACUTE ONLY): 32 min  Charges:  $Therapeutic Activity: 23-37 mins                    Xaviera Flaten H. Manson Passey, PT, DPT, NCS 10/22/17, 10:59 AM [phone]

## 2017-10-22 NOTE — Progress Notes (Signed)
[facility] - [place] at [facility]   PATIENT NAME: Jeremiah James    MR#:  [id number]  DATE OF BIRTH:  1933/08/12  SUBJECTIVE:  CHIEF COMPLAINT:   Chief Complaint  Patient presents with  . Weakness   -Complains of significant feet pain.  Tenderness along the metatarsal joints on both feet  REVIEW OF SYSTEMS:  Review of Systems  Constitutional: Positive for malaise/fatigue. Negative for chills and fever.  HENT: Negative for congestion, ear discharge, hearing loss and nosebleeds.   Eyes: Negative for blurred vision and double vision.  Respiratory: Negative for cough, shortness of breath and wheezing.   Cardiovascular: Negative for chest pain and palpitations.  Gastrointestinal: Negative for abdominal pain, constipation, diarrhea, nausea and vomiting.  Genitourinary: Negative for dysuria.  Musculoskeletal: Positive for joint pain and myalgias.  Neurological: Negative for dizziness, sensory change, speech change, focal weakness, seizures and headaches.  Psychiatric/Behavioral: Negative for depression.    DRUG ALLERGIES:   Allergies  Allergen Reactions  . Morphine Rash    unknown  . Hydrocodone-Acetaminophen Rash    agitation    VITALS:  Blood pressure 112/61, pulse 77, temperature (!) 97.4 F (36.3 C), temperature source Oral, resp. rate 20, height 5\' 11"  (1.803 m), weight 102.5 kg, SpO2 95 %.  PHYSICAL EXAMINATION:  Physical Exam  GENERAL:  84 y.o.-year-old patient lying in the bed with no acute distress.  EYES: Pupils equal, round, reactive to light and accommodation. No scleral icterus. Extraocular muscles intact.  HEENT: Head atraumatic, normocephalic. Oropharynx and nasopharynx clear.  NECK:  Supple, no jugular venous distention. No thyroid enlargement, no tenderness.  LUNGS: Normal breath sounds bilaterally, no wheezing, rhonchi or crepitation. Fine crackles at left base.No use of accessory muscles of respiration.  CARDIOVASCULAR: S1, S2 normal.  No  rubs, or gallops. 3/6 systolic murmur present. ABDOMEN: Soft, nontender, nondistended. Bowel sounds present. No organomegaly or mass.  EXTREMITIES: No pedal edema, cyanosis, or clubbing.  No swelling or redness noted along the metatarsal phalangeal joints bilaterally  but has tenderness on exam. NEUROLOGIC: Cranial nerves II through XII are intact. Muscle strength 5/5 in all extremities. Sensation intact. Gait not checked. Global weakness noted. PSYCHIATRIC: The patient is alert and oriented x 3.  SKIN: No obvious rash, lesion, or ulcer.    LABORATORY PANEL:   CBC Recent Labs  Lab 10/22/17 0532  WBC 11.6*  HGB 11.0*  HCT 30.2*  PLT 196   ------------------------------------------------------------------------------------------------------------------  Chemistries  Recent Labs  Lab 10/20/17 0837 10/21/17 0331 10/22/17 0532  NA 141 141 140  K 3.0* 3.7 3.4*  CL 108 107 105  CO2 24 23 24   GLUCOSE 110* 161* 114*  BUN 18 24* 19  CREATININE 1.29* 1.49* 1.15  CALCIUM 8.1* 8.1* 8.2*  MG 1.5* 1.9  --   AST 170*  --   --   ALT 30  --   --   ALKPHOS 37*  --   --   BILITOT 1.5*  --   --    ------------------------------------------------------------------------------------------------------------------  Cardiac Enzymes Recent Labs  Lab 10/20/17 0837  TROPONINI 0.06*   ------------------------------------------------------------------------------------------------------------------  RADIOLOGY:  No results found.  EKG:   Orders placed or performed during the hospital encounter of 10/19/17  . EKG 12-Lead  . EKG 12-Lead    ASSESSMENT AND PLAN:   84 y/o male with PMH significant for chronic afib on coumadin, CAD, h/o TIA presents secondary to weakness and sepsis  1. Sepsis- tachycardia and leukocytosis - cultures negative so  far., MRSA pcr negative-  -Was on cefepime.  Change to Augmentin to cover for UTI and bronchitis -wbc is improving - no GI symptoms  2.  Chronic afib- on verapamil On coumadin for anti coagulation-pharmacy adjusting the dose  3. Hypokalemia- replaced  4.HTN- imdur  5. GERD- protonix  6. DVT prophylaxis- on prophylactic doseof lovenox until INR can get therapeutic  7.  Bilateral feet pain-had a fall at home prior to admission.  No trauma noted.  Tender along the metatarsophalangeal joints.  Check uric acid. -X-rays ordered.  PT consulted for generalized weakness-recommended rehab. Will need rehab at discharge     All the records are reviewed and case discussed with Care Management/Social Workerr. Management plans discussed with the patient, family and they are in agreement.  CODE STATUS: Full Code  TOTAL TIME TAKING CARE OF THIS PATIENT: 40 minutes.   POSSIBLE D/C IN 1-2 DAYS, DEPENDING ON CLINICAL CONDITION.   Enid Baas M.D on 10/22/2017 at 2:44 PM  Between 7am to 6pm - Pager - [phone]  After 6pm go to [email or web address] - Social research officer, government  [facility] [place] [facility]  Office  [phone]  CC: Primary care physician; Shawnee Knapp, MD

## 2017-10-22 NOTE — Care Management Note (Signed)
Case Management Note  Patient Details  Name: Jeremiah James MRN: [id number] Date of Birth: 04/09/1933  Subjective/Objective:  Admitted to [facility] with the diagnosis of sepsis. Lives alone. Daughter is [name] [phone]). Last seen Dr. Ashley Jacobs 2 weeks ago at [facility], Prescriptions are filled at [facility] in [place]. Doesn't remember name of store. No home Health. No skilled nursing. No home oxygen. Rolling walker and standard walker in the home. States he does have Life Alert. Takes care of all basic activities of daily living himself, doesn't drive. Daughter helps with errands. Larey Seat prior to this admission. Fair-good appetite.                   Action/Plan: Physical therapy is recommending skilled nursing at this time. Will continue to follow for needs.   Expected Discharge Date:                  Expected Discharge Plan:     In-House Referral:   yes  Discharge planning Services   yes  Post Acute Care Choice:    Choice offered to:     DME Arranged:    DME Agency:     HH Arranged:    HH Agency:     Status of Service:     If discussed at [employer] of Stay Meetings, dates discussed:    Additional Comments:  Gwenette Greet, RN MSN CCM Care Management [phone] 10/22/2017, 10:47 AM

## 2017-10-22 NOTE — Progress Notes (Signed)
ANTICOAGULATION CONSULT NOTE -  Pharmacy Consult for Warfarin  Indication: atrial fibrillation  Allergies  Allergen Reactions  . Morphine Rash    unknown  . Hydrocodone-Acetaminophen Rash    agitation    Patient Measurements: Height: 5\' 11"  (180.3 cm) Weight: 226 lb (102.5 kg) IBW/kg (Calculated) : 75.3 Heparin Dosing Weight:   Vital Signs: Temp: 98.9 F (37.2 C) (09/27 0418) BP: 132/67 (09/27 0418) Pulse Rate: 77 (09/27 0418)  Labs: Recent Labs    10/19/17 1537 10/19/17 2143 10/20/17 0341 10/20/17 0837 10/21/17 0331 10/21/17 0750 10/22/17 0532  HGB 12.8*  --   --  11.0* 10.6*  --  11.0*  HCT 36.7*  --   --  32.7* 31.2*  --  30.2*  PLT 173  --   --  165 175  --  196  APTT 26  --   --   --   --   --   --   LABPROT 17.1*  --   --  16.4*  --  17.4* 17.8*  INR 1.41  --   --  1.33  --  1.44 1.48  CREATININE 1.56*  --   --  1.29* 1.49*  --  1.15  CKTOTAL  --   --   --   --   --   --  602*  TROPONINI 0.04* 0.05* 0.06* 0.06*  --   --   --     Estimated Creatinine Clearance: 60.4 mL/min (by C-G formula based on SCr of 1.15 mg/dL).   Medical History: Past Medical History:  Diagnosis Date  . A-fib (HCC)   . MI (myocardial infarction) (HCC)    x6    Medications:  Medications Prior to Admission  Medication Sig Dispense Refill Last Dose  . allopurinol (ZYLOPRIM) 100 MG tablet Take 1 tablet by mouth daily.   10/19/2017 at Unknown time  . atorvastatin (LIPITOR) 40 MG tablet Take 40 mg by mouth daily.   10/19/2017 at Unknown time  . hydrochlorothiazide (HYDRODIURIL) 25 MG tablet Take 1 tablet by mouth daily.   10/19/2017 at Unknown time  . isosorbide mononitrate (IMDUR) 30 MG 24 hr tablet Take 1 tablet by mouth daily.   10/19/2017 at Unknown time  . nitroGLYCERIN (NITROSTAT) 0.4 MG SL tablet Place 0.4 mg under the tongue every 5 (five) minutes as needed for chest pain.   PRN at PRN  . pantoprazole (PROTONIX) 40 MG tablet Take 1 tablet by mouth daily.   10/19/2017 at Unknown  time  . potassium chloride SA (K-DUR,KLOR-CON) 20 MEQ tablet Take 20 mEq by mouth 2 (two) times daily.   10/19/2017 at Unknown time  . tamsulosin (FLOMAX) 0.4 MG CAPS capsule Take 1 capsule by mouth 2 (two) times daily.   10/19/2017 at Unknown time  . verapamil (CALAN) 80 MG tablet Take 80 mg by mouth 2 (two) times daily.   10/19/2017 at Unknown time  . warfarin (COUMADIN) 2.5 MG tablet Take 1.25 mg by mouth at bedtime.   10/18/2017 at Unknown time  . acetaminophen (TYLENOL) 325 MG tablet Take 1 tablet by mouth 3 (three) times daily as needed.   PRN at PRN    Assessment: 84 yom cc weakness with PMH AF on VKA, MI x 6, history of TIA. Sepsis (of unknown source) protocol was initiated due to leukocytosis and tachycardia. 9/25 PCT 0.24 and CXR had no noted abnormality. Patient also noted to have AKI and is being rehydrated.   Home Warfarin dose= 1.25 mg daily  WARFARIN COURSE DATE INR DOSE  9/24 1.41 2 mg 9/25 1.33 2 mg 9/26 1.44 2.5 mg 9/27 1.48  Goal of Therapy:  INR 2-3   Plan:  INR subtherapeutic. Dose was modestly increased yesterday and we will continue with that today. Warfarin 2.5 mg po daily. Recheck INR daily until therapeutic x 2.   Carola Frost, Pharm.D., BCPS Clinical Pharmacist 10/22/2017,8:16 AM

## 2017-10-23 LAB — PROTIME-INR
INR: 1.6
Prothrombin Time: 18.9 seconds — ABNORMAL HIGH (ref 11.4–15.2)

## 2017-10-23 LAB — BASIC METABOLIC PANEL
ANION GAP: 10 (ref 5–15)
BUN: 21 mg/dL (ref 8–23)
CHLORIDE: 105 mmol/L (ref 98–111)
CO2: 26 mmol/L (ref 22–32)
Calcium: 8.2 mg/dL — ABNORMAL LOW (ref 8.9–10.3)
Creatinine, Ser: 1.21 mg/dL (ref 0.61–1.24)
GFR calc Af Amer: >60 mL/min (ref >60)
GFR, EST NON AFRICAN AMERICAN: 54 mL/min — AB (ref >60)
GLUCOSE: 110 mg/dL — AB (ref 70–99)
POTASSIUM: 3.5 mmol/L (ref 3.5–5.1)
Sodium: 141 mmol/L (ref 135–145)

## 2017-10-23 LAB — CK: Total CK: 262 U/L (ref 49–397)

## 2017-10-23 LAB — MAGNESIUM: Magnesium: 1.7 mg/dL (ref 1.7–2.4)

## 2017-10-23 MED ORDER — SODIUM CHLORIDE 0.9 % IV SOLN
INTRAVENOUS | Status: DC
  Administered 2017-10-23 – 2017-10-25 (×3): via INTRAVENOUS

## 2017-10-23 NOTE — Progress Notes (Signed)
ANTICOAGULATION CONSULT NOTE -  Pharmacy Consult for Warfarin  Indication: atrial fibrillation  Allergies  Allergen Reactions  . Morphine Rash    unknown  . Hydrocodone-Acetaminophen Rash    agitation    Patient Measurements: Height: 5\' 11"  (180.3 cm) Weight: 226 lb (102.5 kg) IBW/kg (Calculated) : 75.3 Heparin Dosing Weight:   Vital Signs: Temp: 99.2 F (37.3 C) (09/28 0443) Temp Source: Oral (09/28 0443) BP: 155/94 (09/28 0443) Pulse Rate: 99 (09/28 0443)  Labs: Recent Labs    10/20/17 0837 10/21/17 0331 10/21/17 0750 10/22/17 0532 10/23/17 0510  HGB 11.0* 10.6*  --  11.0*  --   HCT 32.7* 31.2*  --  30.2*  --   PLT 165 175  --  196  --   LABPROT 16.4*  --  17.4* 17.8* 18.9*  INR 1.33  --  1.44 1.48 1.60  CREATININE 1.29* 1.49*  --  1.15 1.21  CKTOTAL  --   --   --  602*  --   TROPONINI 0.06*  --   --   --   --     Estimated Creatinine Clearance: 57.4 mL/min (by C-G formula based on SCr of 1.21 mg/dL).   Medical History: Past Medical History:  Diagnosis Date  . A-fib (HCC)   . MI (myocardial infarction) (HCC)    x6    Medications:  Medications Prior to Admission  Medication Sig Dispense Refill Last Dose  . allopurinol (ZYLOPRIM) 100 MG tablet Take 1 tablet by mouth daily.   10/19/2017 at Unknown time  . atorvastatin (LIPITOR) 40 MG tablet Take 40 mg by mouth daily.   10/19/2017 at Unknown time  . hydrochlorothiazide (HYDRODIURIL) 25 MG tablet Take 1 tablet by mouth daily.   10/19/2017 at Unknown time  . isosorbide mononitrate (IMDUR) 30 MG 24 hr tablet Take 1 tablet by mouth daily.   10/19/2017 at Unknown time  . nitroGLYCERIN (NITROSTAT) 0.4 MG SL tablet Place 0.4 mg under the tongue every 5 (five) minutes as needed for chest pain.   PRN at PRN  . pantoprazole (PROTONIX) 40 MG tablet Take 1 tablet by mouth daily.   10/19/2017 at Unknown time  . potassium chloride SA (K-DUR,KLOR-CON) 20 MEQ tablet Take 20 mEq by mouth 2 (two) times daily.   10/19/2017 at  Unknown time  . tamsulosin (FLOMAX) 0.4 MG CAPS capsule Take 1 capsule by mouth 2 (two) times daily.   10/19/2017 at Unknown time  . verapamil (CALAN) 80 MG tablet Take 80 mg by mouth 2 (two) times daily.   10/19/2017 at Unknown time  . warfarin (COUMADIN) 2.5 MG tablet Take 1.25 mg by mouth at bedtime.   10/18/2017 at Unknown time  . acetaminophen (TYLENOL) 325 MG tablet Take 1 tablet by mouth 3 (three) times daily as needed.   PRN at PRN    Assessment: 84 yom cc weakness with PMH AF on VKA, MI x 6, history of TIA. Sepsis (of unknown source) protocol was initiated due to leukocytosis and tachycardia. 9/25 PCT 0.24 and CXR had no noted abnormality. Patient also noted to have AKI and is being rehydrated.   Home Warfarin dose= 1.25 mg daily  WARFARIN COURSE DATE INR DOSE  9/24 1.41 2 mg 9/25 1.33 2 mg 9/26 1.44 2.5 mg 9/27 1.48 2.5 mg 9/28 1.6  Goal of Therapy:  INR 2-3   Plan:  INR subtherapeutic but rising appropriately. Continue warfarin 2.5 mg po daily and monitor INR daily until INR therapeutic x 2.  Carola Frost, Pharm.D., BCPS Clinical Pharmacist 10/23/2017,7:41 AM

## 2017-10-23 NOTE — Progress Notes (Addendum)
[facility] - [place] at [facility]   PATIENT NAME: Jeremiah James    MR#:  [id number]  DATE OF BIRTH:  Jun 09, 1933  SUBJECTIVE:   Patient doing fine this am am No painful feet this am REVIEW OF SYSTEMS:    Review of Systems  Constitutional: Negative for fever, chills weight loss HENT: Negative for ear pain, nosebleeds, congestion, facial swelling, rhinorrhea, neck pain, neck stiffness and ear discharge.   Respiratory: Negative for cough, shortness of breath, wheezing  Cardiovascular: Negative for chest pain, palpitations and leg swelling.  Gastrointestinal: Negative for heartburn, abdominal pain, vomiting, diarrhea or consitpation Genitourinary: Negative for dysuria, urgency, frequency, hematuria Musculoskeletal: Negative for back pain or joint pain Neurological: Negative for dizziness, seizures, syncope, focal weakness,  numbness and headaches.  Hematological: Does not bruise/bleed easily.  Psychiatric/Behavioral: Negative for hallucinations, confusion, dysphoric mood    Tolerating Diet: yes      DRUG ALLERGIES:   Allergies  Allergen Reactions  . Morphine Rash    unknown  . Hydrocodone-Acetaminophen Rash    agitation    VITALS:  Blood pressure 109/67, pulse (!) 106, temperature 98.4 F (36.9 C), temperature source Oral, resp. rate (!) 22, height 5\' 11"  (1.803 m), weight 102.5 kg, SpO2 92 %.  PHYSICAL EXAMINATION:  Constitutional: Appears well-developed and well-nourished. No distress. HENT: Normocephalic. Marland Kitchen Oropharynx is clear and moist.  Eyes: Conjunctivae and EOM are normal. PERRLA, no scleral icterus.  Neck: Normal ROM. Neck supple. No JVD. No tracheal deviation. CVS: RRR, S1/S2 +, no murmurs, no gallops, no carotid bruit.  Pulmonary: Effort and breath sounds normal, no stridor, rhonchi, wheezes, rales.  Abdominal: Soft. BS +,  no distension, tenderness, rebound or guarding.  Musculoskeletal: Normal range of motion. No edema and no tenderness.   Neuro: Alert. CN 2-12 grossly intact. No focal deficits. Skin: Skin is warm and dry. No rash noted. Psychiatric: Normal mood and affect.      LABORATORY PANEL:   CBC Recent Labs  Lab 10/22/17 0532  WBC 11.6*  HGB 11.0*  HCT 30.2*  PLT 196   ------------------------------------------------------------------------------------------------------------------  Chemistries  Recent Labs  Lab 10/20/17 0837 10/21/17 0331  10/23/17 0510  NA 141 141   < > 141  K 3.0* 3.7   < > 3.5  CL 108 107   < > 105  CO2 24 23   < > 26  GLUCOSE 110* 161*   < > 110*  BUN 18 24*   < > 21  CREATININE 1.29* 1.49*   < > 1.21  CALCIUM 8.1* 8.1*   < > 8.2*  MG 1.5* 1.9  --   --   AST 170*  --   --   --   ALT 30  --   --   --   ALKPHOS 37*  --   --   --   BILITOT 1.5*  --   --   --    < > = values in this interval not displayed.   ------------------------------------------------------------------------------------------------------------------  Cardiac Enzymes Recent Labs  Lab 10/19/17 2143 10/20/17 0341 10/20/17 0837  TROPONINI 0.05* 0.06* 0.06*   ------------------------------------------------------------------------------------------------------------------  RADIOLOGY:  Dg Foot Complete Left  Result Date: 10/22/2017 CLINICAL DATA:  84 year old male with a history of foot pain EXAM: LEFT FOOT - COMPLETE 3+ VIEW COMPARISON:  None. FINDINGS: No acute fracture identified. Degenerative changes of the interphalangeal joints. Degenerative changes of the midfoot and hindfoot. Calcifications of the tibial vessels and the pedal vessels. IMPRESSION: No acute  bony abnormality. Advanced tibial and pedal atherosclerosis. Electronically Signed   By: Gilmer Mor D.O.   On: 10/22/2017 15:29   Dg Foot Complete Right  Result Date: 10/22/2017 CLINICAL DATA:  Foot pain.  Fall. EXAM: RIGHT FOOT COMPLETE - 3+ VIEW COMPARISON:  No recent. FINDINGS: Diffuse degenerative change. No evidence of fracture  dislocation. No acute bony or joint abnormality identified. No evidence of fracture. Peripheral vascular calcification. IMPRESSION: 1. Diffuse degenerative change. Degenerative changes most prominent at the first metatarsophalangeal joint. No acute bony or joint abnormality. 2.  Peripheral vascular disease. Electronically Signed   By: Maisie Fus  Register   On: 10/22/2017 15:32     ASSESSMENT AND PLAN:   84 year old male with chronic atrial fibrillation on Coumadin and CAD who presented due to generalized weakness.  1.  Sepsis: Patient presented with tachycardia and leukocytosis. Sepsis felt to be due to urinary tract infection and acute bronchitis.  Urine culture however is negative as well as blood cultures.  Chest x-ray showed no evidence of pneumonia. Continue Augmentin for 2 more days.  2.  Hypokalemia: This is repleted as needed  3.  BPH: Continue Flomax  4.  Essential hypertension: Continue verapamil and isosorbide  5.  Chronic atrial fibrillation: Continue verapamil for heart rate control and pharmacy dosing Coumadin  6.  CAD: Continue atorvastatin, isosorbide   7. Bilateral feet pain : Does not seem to be an issues today Xrays consistent with DJD.  8. Mild rhabdo after fall: Continue IVF.   Management plans discussed with the patient and he is in agreement.  CODE STATUS: DNR  TOTAL TIME TAKING CARE OF THIS PATIENT: 22 minutes.     POSSIBLE D/C Monday to snf, DEPENDING ON CLINICAL CONDITION.   Lee Kuang M.D on 10/23/2017 at 11:14 AM  Between 7am to 6pm - Pager - [phone] After 6pm go to [email or web address] - password [email or web address]  [facility] [place] [facility]  Office  [phone]  CC: Primary care physician; Shawnee Knapp, MD  Note: This dictation was prepared with Dragon dictation along with smaller phrase technology. Any transcriptional errors that result from this process are unintentional.

## 2017-10-23 NOTE — Progress Notes (Signed)
Family Meeting Note  Advance Directive:yes  Today a meeting took place with the Patient. The following clinical team members were present during this meeting:MD  The following were discussed:Patient's diagnosis: Sepsis Chronic atrial fibrillation, Patient's progosis: Unable to determine and Goals for treatment: DNR  Additional follow-up to be provided: DNR order written in electronic chart and yellow sheet will be signed and placed in patient's chart  Time spent during discussion 16 minutes  Costanzo, MD

## 2017-10-24 ENCOUNTER — Inpatient Hospital Stay: Payer: Medicare HMO

## 2017-10-24 LAB — PROTIME-INR
INR: 1.71
PROTHROMBIN TIME: 19.9 s — AB (ref 11.4–15.2)

## 2017-10-24 LAB — CULTURE, BLOOD (ROUTINE X 2)
CULTURE: NO GROWTH
Culture: NO GROWTH
Special Requests: ADEQUATE
Special Requests: ADEQUATE

## 2017-10-24 MED ORDER — DOCUSATE SODIUM 283 MG RE ENEM
1.0000 | ENEMA | Freq: Every day | RECTAL | Status: DC
  Filled 2017-10-24 (×3): qty 1

## 2017-10-24 MED ORDER — BISACODYL 10 MG RE SUPP
10.0000 mg | Freq: Every day | RECTAL | Status: DC
  Administered 2017-10-24: 08:00:00 10 mg via RECTAL
  Filled 2017-10-24 (×2): qty 1

## 2017-10-24 MED ORDER — FUROSEMIDE 10 MG/ML IJ SOLN
20.0000 mg | Freq: Once | INTRAMUSCULAR | Status: AC
  Administered 2017-10-24: 12:00:00 20 mg via INTRAVENOUS
  Filled 2017-10-24: qty 2

## 2017-10-24 MED ORDER — WARFARIN SODIUM 3 MG PO TABS
3.0000 mg | ORAL_TABLET | Freq: Once | ORAL | Status: AC
  Administered 2017-10-24: 3 mg via ORAL
  Filled 2017-10-24: qty 1

## 2017-10-24 MED ORDER — POLYETHYLENE GLYCOL 3350 17 G PO PACK
17.0000 g | PACK | Freq: Every day | ORAL | Status: DC
  Administered 2017-10-24: 08:00:00 17 g via ORAL
  Filled 2017-10-24 (×2): qty 1

## 2017-10-24 NOTE — Progress Notes (Addendum)
ANTICOAGULATION CONSULT NOTE -  Pharmacy Consult for Warfarin  Indication: atrial fibrillation  Allergies  Allergen Reactions  . Morphine Rash    unknown  . Hydrocodone-Acetaminophen Rash    agitation    Patient Measurements: Height: 5\' 11"  (180.3 cm) Weight: 226 lb (102.5 kg) IBW/kg (Calculated) : 75.3 Heparin Dosing Weight:   Vital Signs: Temp: 99 F (37.2 C) (09/29 0421) Temp Source: Oral (09/29 0421) BP: 147/74 (09/29 0421) Pulse Rate: 104 (09/29 0421)  Labs: Recent Labs    10/22/17 0532 10/23/17 0510 10/23/17 1205 10/24/17 0450  HGB 11.0*  --   --   --   HCT 30.2*  --   --   --   PLT 196  --   --   --   LABPROT 17.8* 18.9*  --  19.9*  INR 1.48 1.60  --  1.71  CREATININE 1.15 1.21  --   --   CKTOTAL 602*  --  262  --     Estimated Creatinine Clearance: 57.4 mL/min (by C-G formula based on SCr of 1.21 mg/dL).   Medical History: Past Medical History:  Diagnosis Date  . A-fib (HCC)   . MI (myocardial infarction) (HCC)    x6    Medications:  Medications Prior to Admission  Medication Sig Dispense Refill Last Dose  . allopurinol (ZYLOPRIM) 100 MG tablet Take 1 tablet by mouth daily.   10/19/2017 at Unknown time  . atorvastatin (LIPITOR) 40 MG tablet Take 40 mg by mouth daily.   10/19/2017 at Unknown time  . hydrochlorothiazide (HYDRODIURIL) 25 MG tablet Take 1 tablet by mouth daily.   10/19/2017 at Unknown time  . isosorbide mononitrate (IMDUR) 30 MG 24 hr tablet Take 1 tablet by mouth daily.   10/19/2017 at Unknown time  . nitroGLYCERIN (NITROSTAT) 0.4 MG SL tablet Place 0.4 mg under the tongue every 5 (five) minutes as needed for chest pain.   PRN at PRN  . pantoprazole (PROTONIX) 40 MG tablet Take 1 tablet by mouth daily.   10/19/2017 at Unknown time  . potassium chloride SA (K-DUR,KLOR-CON) 20 MEQ tablet Take 20 mEq by mouth 2 (two) times daily.   10/19/2017 at Unknown time  . tamsulosin (FLOMAX) 0.4 MG CAPS capsule Take 1 capsule by mouth 2 (two) times  daily.   10/19/2017 at Unknown time  . verapamil (CALAN) 80 MG tablet Take 80 mg by mouth 2 (two) times daily.   10/19/2017 at Unknown time  . warfarin (COUMADIN) 2.5 MG tablet Take 1.25 mg by mouth at bedtime.   10/18/2017 at Unknown time  . acetaminophen (TYLENOL) 325 MG tablet Take 1 tablet by mouth 3 (three) times daily as needed.   PRN at PRN    Assessment: 84 yom cc weakness with PMH AF on VKA, MI x 6, history of TIA. Sepsis (of unknown source) protocol was initiated due to leukocytosis and tachycardia. 9/25 PCT 0.24 and CXR had no noted abnormality. Patient also noted to have AKI and is being rehydrated.  INR was subtherapeutic on admission.   Home Warfarin dose= 1.25 mg daily  WARFARIN COURSE DATE INR DOSE  9/24 1.41 2 mg 9/25 1.33 2 mg 9/26 1.44 2.5 mg 9/27 1.48 2.5 mg 9/28 1.6 2.5 mg 9/29 1.71   Goal of Therapy:  INR 2-3   Plan:  INR subtherapeutic but continues to rise slowly. Will do warfarin 3mg  x 1 dose tonight.  Continue to monitor INR daily while on antibiotics and until INR therapeutic x 2.  CBC ordered with AM labs.   Abubakar Crispo M Alfred Harrel, Pharm.D., BCPS Clinical Pharmacist 10/24/2017,7:35 AM

## 2017-10-24 NOTE — Progress Notes (Signed)
Per pt and family request pt assisted/pivot to the [facility] despite education of possible safety risk and risk for injury family and pt continue to insist on getting up to [facility], pt x3 heavy assist,  pt still very weak, pt had large BM and assisted back to bed x3 heavy assist

## 2017-10-24 NOTE — Progress Notes (Signed)
[facility] [place] at [facility]   PATIENT NAME: Jeremiah James    MR#:  [id number]  DATE OF BIRTH:  1933/09/29  SUBJECTIVE:   Patient was feeling a bit SOB this am and very weak  REVIEW OF SYSTEMS:    Review of Systems  Constitutional: Negative for fever, chills weight loss ++ gen weakness HENT: Negative for ear pain, nosebleeds, congestion, facial swelling, rhinorrhea, neck pain, neck stiffness and ear discharge.   Respiratory: Negative for cough, ++shortness of breath, NO wheezing  Cardiovascular: Negative for chest pain, palpitations and leg swelling.  Gastrointestinal: Negative for heartburn, abdominal pain, vomiting, diarrhea or consitpation Genitourinary: Negative for dysuria, urgency, frequency, hematuria Musculoskeletal: Negative for back pain or joint pain Neurological: Negative for dizziness, seizures, syncope, focal weakness,  numbness and headaches.  Hematological: Does not bruise/bleed easily.  Psychiatric/Behavioral: Negative for hallucinations, confusion, dysphoric mood    Tolerating Diet: yes      DRUG ALLERGIES:   Allergies  Allergen Reactions  . Morphine Rash    unknown  . Hydrocodone-Acetaminophen Rash    agitation    VITALS:  Blood pressure (!) 171/78, pulse (!) 115, temperature 99 F (37.2 C), temperature source Oral, resp. rate 18, height 5\' 11"  (1.803 m), weight 102.5 kg, SpO2 92 %.  PHYSICAL EXAMINATION:  Constitutional: Appears well-developed and well-nourished. No distress. HENT: Normocephalic. [place] Oropharynx is clear and moist.  Eyes: Conjunctivae and EOM are normal. PERRLA, no scleral icterus.  Neck: Normal ROM. Neck supple. No JVD. No tracheal deviation. CVS: RRR, S1/S2 +, no murmurs, no gallops, no carotid bruit.  Pulmonary: decreased bases no rhonchii, wheezing Abdominal: Soft. BS +,  no distension, tenderness, rebound or guarding.  Musculoskeletal: Normal range of motion. No edema and no tenderness.  Neuro:  Alert. CN 2-12 grossly intact. No focal deficits. Skin: Skin is warm and dry. No rash noted. Psychiatric: Normal mood and affect.      LABORATORY PANEL:   CBC Recent Labs  Lab 10/22/17 0532  WBC 11.6*  HGB 11.0*  HCT 30.2*  PLT 196   ------------------------------------------------------------------------------------------------------------------  Chemistries  Recent Labs  Lab 10/20/17 0837  10/23/17 0510 10/23/17 1205  NA 141   < > 141  --   K 3.0*   < > 3.5  --   CL 108   < > 105  --   CO2 24   < > 26  --   GLUCOSE 110*   < > 110*  --   BUN 18   < > 21  --   CREATININE 1.29*   < > 1.21  --   CALCIUM 8.1*   < > 8.2*  --   MG 1.5*   < >  --  1.7  AST 170*  --   --   --   ALT 30  --   --   --   ALKPHOS 37*  --   --   --   BILITOT 1.5*  --   --   --    < > = values in this interval not displayed.   ------------------------------------------------------------------------------------------------------------------  Cardiac Enzymes Recent Labs  Lab 10/19/17 2143 10/20/17 0341 10/20/17 0837  TROPONINI 0.05* 0.06* 0.06*   ------------------------------------------------------------------------------------------------------------------  RADIOLOGY:  Dg Chest 1 View  Result Date: 10/24/2017 CLINICAL DATA:  Shortness of breath EXAM: CHEST  1 VIEW COMPARISON:  October 19, 2017 chest radiograph and chest CT FINDINGS: There is a small left pleural effusion with left base atelectasis. The lungs  elsewhere are clear. Heart is mildly enlarged with pulmonary vascularity normal. No adenopathy. There is aortic atherosclerosis. No bone lesions. IMPRESSION: Small left pleural effusion with mild left base atelectasis. No edema or consolidation. Stable cardiac prominence. There is aortic atherosclerosis. Aortic Atherosclerosis (ICD10-I70.0). Electronically Signed   By: Bretta Bang III M.D.   On: 10/24/2017 10:11   Dg Foot Complete Left  Result Date: 10/22/2017 CLINICAL DATA:   84 year old male with a history of foot pain EXAM: LEFT FOOT - COMPLETE 3+ VIEW COMPARISON:  None. FINDINGS: No acute fracture identified. Degenerative changes of the interphalangeal joints. Degenerative changes of the midfoot and hindfoot. Calcifications of the tibial vessels and the pedal vessels. IMPRESSION: No acute bony abnormality. Advanced tibial and pedal atherosclerosis. Electronically Signed   By: Gilmer Mor D.O.   On: 10/22/2017 15:29   Dg Foot Complete Right  Result Date: 10/22/2017 CLINICAL DATA:  Foot pain.  Fall. EXAM: RIGHT FOOT COMPLETE - 3+ VIEW COMPARISON:  No recent. FINDINGS: Diffuse degenerative change. No evidence of fracture dislocation. No acute bony or joint abnormality identified. No evidence of fracture. Peripheral vascular calcification. IMPRESSION: 1. Diffuse degenerative change. Degenerative changes most prominent at the first metatarsophalangeal joint. No acute bony or joint abnormality. 2.  Peripheral vascular disease. Electronically Signed   By: Maisie Fus  Register   On: 10/22/2017 15:32     ASSESSMENT AND PLAN:   84 year old male with chronic atrial fibrillation on Coumadin and CAD who presented due to generalized weakness.  1.  Sepsis: Patient presented with tachycardia and leukocytosis. Sepsis felt to be due to urinary tract infection and acute bronchitis.  Urine culture however is negative as well as blood cultures.  Chest x-ray showed no evidence of pneumonia. Continue Augmentin for 1 more day.  2.  Hypokalemia: This is repleted as needed  3.  BPH: Continue Flomax  4.  Essential hypertension: Continue verapamil and isosorbide  5.  Chronic atrial fibrillation: Continue verapamil for heart rate control and pharmacy dosing Coumadin  6.  CAD: Continue atorvastatin, isosorbide   7. Bilateral feet pain : Does not seem to be an issues today Xrays consistent with DJD.  8. Mild rhabdo after fall: CK has normalized and improved with IV fluids.   9. SOB:  CXR with atelectasis and small effusion Incentive spirometer ordered  Management plans discussed with the patient and he is in agreement. Discussed with daughter CODE STATUS: DNR  TOTAL TIME TAKING CARE OF THIS PATIENT: 22 minutes.     POSSIBLE D/C Monday to snf, DEPENDING ON CLINICAL CONDITION.   Jennifier Smitherman M.D on 10/24/2017 at 11:24 AM  Between 7am to 6pm - Pager - [phone] After 6pm go to [email or web address] - password [email or web address]  [facility]  Office  [phone]  CC: Primary care physician; Shawnee Knapp, MD  Note: This dictation was prepared with Dragon dictation along with smaller phrase technology. Any transcriptional errors that result from this process are unintentional.

## 2017-10-25 ENCOUNTER — Inpatient Hospital Stay: Payer: Medicare HMO

## 2017-10-25 LAB — BASIC METABOLIC PANEL
Anion gap: 8 (ref 5–15)
BUN: 21 mg/dL (ref 8–23)
CALCIUM: 8 mg/dL — AB (ref 8.9–10.3)
CO2: 27 mmol/L (ref 22–32)
Chloride: 104 mmol/L (ref 98–111)
Creatinine, Ser: 1.08 mg/dL (ref 0.61–1.24)
Glucose, Bld: 104 mg/dL — ABNORMAL HIGH (ref 70–99)
Potassium: 2.8 mmol/L — ABNORMAL LOW (ref 3.5–5.1)
Sodium: 139 mmol/L (ref 135–145)

## 2017-10-25 LAB — CBC
HEMATOCRIT: 28.7 % — AB (ref 40.0–52.0)
Hemoglobin: 10.2 g/dL — ABNORMAL LOW (ref 13.0–18.0)
MCH: 33.2 pg (ref 26.0–34.0)
MCHC: 35.7 g/dL (ref 32.0–36.0)
MCV: 93 fL (ref 80.0–100.0)
Platelets: 227 10*3/uL (ref 150–440)
RBC: 3.08 MIL/uL — ABNORMAL LOW (ref 4.40–5.90)
RDW: 14.3 % (ref 11.5–14.5)
WBC: 12 10*3/uL — ABNORMAL HIGH (ref 3.8–10.6)

## 2017-10-25 LAB — MAGNESIUM: Magnesium: 1.5 mg/dL — ABNORMAL LOW (ref 1.7–2.4)

## 2017-10-25 LAB — PROTIME-INR
INR: 2.26
Prothrombin Time: 24.8 seconds — ABNORMAL HIGH (ref 11.4–15.2)

## 2017-10-25 LAB — POTASSIUM: Potassium: 3.1 mmol/L — ABNORMAL LOW (ref 3.5–5.1)

## 2017-10-25 MED ORDER — BENZONATATE 100 MG PO CAPS: 100.0000 mg | ORAL_CAPSULE | Freq: Three times a day (TID) | ORAL | 0 refills | Status: DC

## 2017-10-25 MED ORDER — POTASSIUM CHLORIDE CRYS ER 20 MEQ PO TBCR: 20.0000 meq | EXTENDED_RELEASE_TABLET | Freq: Every day | ORAL | 1 refills | Status: DC

## 2017-10-25 MED ORDER — SODIUM CHLORIDE 0.9 % IV SOLN
INTRAVENOUS | Status: DC | PRN
  Administered 2017-10-25: 08:00:00 1000 mL via INTRAVENOUS

## 2017-10-25 MED ORDER — WARFARIN SODIUM 2.5 MG PO TABS: 2.5000 mg | ORAL_TABLET | Freq: Every day | ORAL | 1 refills | Status: AC

## 2017-10-25 MED ORDER — WARFARIN SODIUM 3 MG PO TABS
3.0000 mg | ORAL_TABLET | Freq: Once | ORAL | Status: DC
  Filled 2017-10-25: qty 1

## 2017-10-25 MED ORDER — MAGNESIUM SULFATE 2 GM/50ML IV SOLN
2.0000 g | Freq: Once | INTRAVENOUS | Status: AC
  Administered 2017-10-25: 2 g via INTRAVENOUS
  Filled 2017-10-25: qty 50

## 2017-10-25 MED ORDER — AMOXICILLIN-POT CLAVULANATE 875-125 MG PO TABS: 1.0000 | ORAL_TABLET | Freq: Two times a day (BID) | ORAL | 0 refills | Status: AC

## 2017-10-25 MED ORDER — FUROSEMIDE 20 MG PO TABS: 20.0000 mg | ORAL_TABLET | Freq: Every day | ORAL | 1 refills | Status: DC

## 2017-10-25 MED ORDER — POTASSIUM CHLORIDE 10 MEQ/100ML IV SOLN
10.0000 meq | INTRAVENOUS | Status: AC
  Administered 2017-10-25 (×2): 10 meq via INTRAVENOUS
  Filled 2017-10-25 (×2): qty 100

## 2017-10-25 MED ORDER — POTASSIUM CHLORIDE CRYS ER 20 MEQ PO TBCR
40.0000 meq | EXTENDED_RELEASE_TABLET | ORAL | Status: AC
  Administered 2017-10-25 (×2): 40 meq via ORAL
  Filled 2017-10-25 (×2): qty 2

## 2017-10-25 NOTE — Discharge Summary (Signed)
[facility] [place] at [facility]   PATIENT NAME: Jeremiah James    MR#:  [id number]  DATE OF BIRTH:  1933-08-07  DATE OF ADMISSION:  10/19/2017   ADMITTING PHYSICIAN: Ramonita Lab, MD  DATE OF DISCHARGE:  10/25/17  PRIMARY CARE PHYSICIAN: Shawnee Knapp, MD   ADMISSION DIAGNOSIS:   Generalized weakness [R53.1] Leukocytosis, unspecified type [D72.829]  DISCHARGE DIAGNOSIS:   Active Problems:   Sepsis (HCC)   SECONDARY DIAGNOSIS:   Past Medical History:  Diagnosis Date  . A-fib (HCC)   . MI (myocardial infarction) (HCC)    x6    HOSPITAL COURSE:    84 y/o male with PMH significant for chronic afib on coumadin, CAD, h/o TIA presents secondary to weakness and sepsis  1. Sepsis- tachycardia and leukocytosis - cultures negative so far., MRSA pcr negative-  -Was on cefepime.  Changed to Augmentin to cover for UTI and bronchitis -wbc is improving - no GI symptoms  2. Chronic afib- on verapamil On coumadin for anti coagulation   3. Hypokalemia- replaced -Also low magnesium which is being replaced  4.HTN- imdur and low-dose Lasix added instead of hydrochlorothiazide at discharge.  5. GERD- protonix  6.  Bilateral feet pain-had a fall at home prior to admission.  No trauma noted.  Secondary to rhabdomyolysis. -No gout noted on exam or on x-rays. -Symptoms resolved.   PT consulted-rehab at discharge  DISCHARGE CONDITIONS:   Guarded  CONSULTS OBTAINED:   None  DRUG ALLERGIES:   Allergies  Allergen Reactions  . Morphine Rash    unknown  . Hydrocodone-Acetaminophen Rash    agitation   DISCHARGE MEDICATIONS:   Allergies as of 10/25/2017      Reactions   Morphine Rash   unknown   Hydrocodone-acetaminophen Rash   agitation      Medication List    STOP taking these medications   hydrochlorothiazide 25 MG tablet Commonly known as:  HYDRODIURIL     TAKE these medications   acetaminophen 325 MG tablet Commonly known  as:  TYLENOL Take 1 tablet by mouth 3 (three) times daily as needed.   allopurinol 100 MG tablet Commonly known as:  ZYLOPRIM Take 1 tablet by mouth daily.   amoxicillin-clavulanate 875-125 MG tablet Commonly known as:  AUGMENTIN Take 1 tablet by mouth every 12 (twelve) hours for 3 days.   atorvastatin 40 MG tablet Commonly known as:  LIPITOR Take 40 mg by mouth daily.   benzonatate 100 MG capsule Commonly known as:  TESSALON Take 1 capsule (100 mg total) by mouth 3 (three) times daily.   furosemide 20 MG tablet Commonly known as:  LASIX Take 1 tablet (20 mg total) by mouth daily.   isosorbide mononitrate 30 MG 24 hr tablet Commonly known as:  IMDUR Take 1 tablet by mouth daily.   nitroGLYCERIN 0.4 MG SL tablet Commonly known as:  NITROSTAT Place 0.4 mg under the tongue every 5 (five) minutes as needed for chest pain.   pantoprazole 40 MG tablet Commonly known as:  PROTONIX Take 1 tablet by mouth daily.   potassium chloride SA 20 MEQ tablet Commonly known as:  K-DUR,KLOR-CON Take 1 tablet (20 mEq total) by mouth daily. What changed:  when to take this   tamsulosin 0.4 MG Caps capsule Commonly known as:  FLOMAX Take 1 capsule by mouth 2 (two) times daily.   verapamil 80 MG tablet Commonly known as:  CALAN Take 80 mg by mouth 2 (two) times daily.  warfarin 2.5 MG tablet Commonly known as:  COUMADIN Take 1 tablet (2.5 mg total) by mouth at bedtime. What changed:  how much to take        DISCHARGE INSTRUCTIONS:   1. PCP f/u in 1-2 weeks  DIET:   Cardiac diet  ACTIVITY:   Activity as tolerated  OXYGEN:   Home Oxygen: Yes.    Oxygen Delivery: 2 liters/min via Patient connected to nasal cannula oxygen  DISCHARGE LOCATION:   nursing home   If you experience worsening of your admission symptoms, develop shortness of breath, life threatening emergency, suicidal or homicidal thoughts you must seek medical attention immediately by calling 911 or  calling your MD immediately  if symptoms less severe.  You Must read complete instructions/literature along with all the possible adverse reactions/side effects for all the Medicines you take and that have been prescribed to you. Take any new Medicines after you have completely understood and accpet all the possible adverse reactions/side effects.   Please note  You were cared for by a hospitalist during your hospital stay. If you have any questions about your discharge medications or the care you received while you were in the hospital after you are discharged, you can call the unit and asked to speak with the hospitalist on call if the hospitalist that took care of you is not available. Once you are discharged, your primary care physician will handle any further medical issues. Please note that NO REFILLS for any discharge medications will be authorized once you are discharged, as it is imperative that you return to your primary care physician (or establish a relationship with a primary care physician if you do not have one) for your aftercare needs so that they can reassess your need for medications and monitor your lab values.    On the day of Discharge:  VITAL SIGNS:   Blood pressure 136/81, pulse 77, temperature 98.1 F (36.7 C), temperature source Oral, resp. rate 18, height 5\' 11"  (1.803 m), weight 102.5 kg, SpO2 97 %.  PHYSICAL EXAMINATION:    GENERAL:  84 y.o.-year-old patient lying in the bed with no acute distress.  EYES: Pupils equal, round, reactive to light and accommodation. No scleral icterus. Extraocular muscles intact.  HEENT: Head atraumatic, normocephalic. Oropharynx and nasopharynx clear.  NECK:  Supple, no jugular venous distention. No thyroid enlargement, no tenderness.  LUNGS: Normal breath sounds bilaterally, no wheezing, rhonchi or crepitation. Fine crackles at left base.No use of accessory muscles of respiration.  CARDIOVASCULAR: S1, S2 normal. No  rubs, or  gallops. 3/6 systolic murmur present. ABDOMEN: Soft, nontender, nondistended. Bowel sounds present. No organomegaly or mass.  EXTREMITIES: No pedal edema, cyanosis, or clubbing.  No swelling or redness noted along the metatarsal phalangeal joints bilaterally  but has tenderness on exam. NEUROLOGIC: Cranial nerves II through XII are intact. Muscle strength 5/5 in all extremities. Sensation intact. Gait not checked. Global weakness noted. PSYCHIATRIC: The patient is alert and oriented x 3.  SKIN: No obvious rash, lesion, or ulcer.   DATA REVIEW:   CBC Recent Labs  Lab 10/25/17 0303  WBC 12.0*  HGB 10.2*  HCT 28.7*  PLT 227    Chemistries  Recent Labs  Lab 10/20/17 0837  10/25/17 0303 10/25/17 1100  NA 141   < > 139  --   K 3.0*   < > 2.8* 3.1*  CL 108   < > 104  --   CO2 24   < > 27  --  GLUCOSE 110*   < > 104*  --   BUN 18   < > 21  --   CREATININE 1.29*   < > 1.08  --   CALCIUM 8.1*   < > 8.0*  --   MG 1.5*   < >  --  1.5*  AST 170*  --   --   --   ALT 30  --   --   --   ALKPHOS 37*  --   --   --   BILITOT 1.5*  --   --   --    < > = values in this interval not displayed.     Microbiology Results  Results for orders placed or performed during the hospital encounter of 10/19/17  Blood Culture (routine x 2)     Status: None   Collection Time: 10/19/17  3:37 PM  Result Value Ref Range Status   Specimen Description BLOOD RIGHT FA  Final   Special Requests   Final    BOTTLES DRAWN AEROBIC AND ANAEROBIC Blood Culture adequate volume   Culture   Final    NO GROWTH 5 DAYS Performed at [facility], [address] [place], [place] [address]    Report Status 10/24/2017 FINAL  Final  Blood Culture (routine x 2)     Status: None   Collection Time: 10/19/17  3:38 PM  Result Value Ref Range Status   Specimen Description BLOOD LEFT [facility]  Final   Special Requests   Final    BOTTLES DRAWN AEROBIC AND ANAEROBIC Blood Culture adequate volume   Culture   Final     NO GROWTH 5 DAYS Performed at [facility], [address] [place], [place] [address]    Report Status 10/24/2017 FINAL  Final  Urine culture     Status: None   Collection Time: 10/19/17  7:26 PM  Result Value Ref Range Status   Specimen Description   Final    URINE, RANDOM Performed at [facility], [address] [place], [place] [address]    Special Requests   Final    NONE Performed at [facility], [address], [place], [place] [address]    Culture   Final    NO GROWTH Performed at [facility], [address], [place], [place] [address]    Report Status 10/21/2017 FINAL  Final  MRSA PCR Screening     Status: None   Collection Time: 10/20/17  9:37 AM  Result Value Ref Range Status   MRSA by PCR NEGATIVE NEGATIVE Final    Comment:        The GeneXpert MRSA Assay (FDA approved for NASAL specimens only), is one component of a comprehensive MRSA colonization surveillance program. It is not intended to diagnose MRSA infection nor to guide or monitor treatment for MRSA infections. Performed at [facility], [address] [place] [place], [place] [address]     RADIOLOGY:  Dg Chest 1 View  Result Date: 10/25/2017 CLINICAL DATA:  CHF EXAM: CHEST  1 VIEW COMPARISON:  10/24/2017 FINDINGS: Cardiomegaly with vascular congestion. Bibasilar atelectasis. No visible effusions. No acute bony abnormality. IMPRESSION: Cardiomegaly with vascular congestion and bibasilar atelectasis. No real change. Electronically Signed   By: Charlett Nose M.D.   On: 10/25/2017 07:56     Management plans discussed with the patient, family and they are in agreement.  CODE STATUS:     Code Status Orders  (From admission,  onward)         Start     Ordered   10/23/17 0918  Do not attempt resuscitation (DNR)  Continuous    Question Answer Comment  In the event of cardiac or respiratory ARREST Do not call a "code blue"   In the event of cardiac or  respiratory ARREST Do not perform Intubation, CPR, defibrillation or ACLS   In the event of cardiac or respiratory ARREST Use medication by any route, position, wound care, and other measures to relive pain and suffering. May use oxygen, suction and manual treatment of airway obstruction as needed for comfort.      10/23/17 4098        Code Status History    Date Active Date Inactive Code Status Order ID Comments User Context   10/19/2017 2151 10/23/2017 0917 Full Code [id number]  Ramonita Lab, MD Inpatient    Advance Directive Documentation     Most Recent Value  Type of Advance Directive  Healthcare Power of Attorney  Pre-existing out of facility DNR order (yellow form or pink MOST form)  -  "MOST" Form in Place?  -      TOTAL TIME TAKING CARE OF THIS PATIENT: 38 minutes.    Sion Thane M.D on 10/25/2017 at 12:00 PM  Between 7am to 6pm - Pager - [phone]  After 6pm go to [email or web address] - Social research officer, government  [facility]  Office  [phone]  CC: Primary care physician; Shawnee Knapp, MD   Note: This dictation was prepared with Dragon dictation along with smaller phrase technology. Any transcriptional errors that result from this process are unintentional.

## 2017-10-25 NOTE — Progress Notes (Signed)
Physical Therapy Treatment Patient Details Name: Jeremiah James MRN: [id number] DOB: February 14, 1933 Today's Date: 10/25/2017    History of Present Illness Pt is an 84 y.o. male with a known history of chronic atrial fibrillation on Coumadin, coronary artery disease status post MI, history of TIA with generalized weakness presented to the ED with worsening weakness.  Patient was unable to get out of the chair and he fell because he was too weak to walk.  Patient was tachycardic, sepsis protocol implemented cultures were done and patient was started on empiric IV antibiotics hospitalist team is called to admit the patient.  Assessment includes: Sepsis of unclear etiology, AKI, A-fib with RVR, CAD, liver lesions, and interstitial lung disease.      PT Comments    Pt agreeable to PT; reports 6/10 pain in B feet and neck. Pt participates in supine and seated exercises with assist as needed. Demonstrates need for Mod A supine to sit and Min sit to supine with Max of 2 to reposition upward in bed. Pt demonstrates Fair sitting balance with use of UEs for support, but decreases to Poor without use of UEs. Pt unable to attempt STS post sitting activities due to dizziness and also wishes to defer due to pain in feet.  Does tolerate increased sitting time. Encouraged performing LE exercises to increase strength and range. Pt to discharge to skilled nursing facility to continue rehab efforts.    Follow Up Recommendations  SNF     Equipment Recommendations       Recommendations for Other Services       Precautions / Restrictions Precautions Precautions: Fall Restrictions Weight Bearing Restrictions: No    Mobility  Bed Mobility Overal bed mobility: Needs Assistance Bed Mobility: Supine to Sit     Supine to sit: Mod assist;HOB elevated(trunk) Sit to supine: Min assist;HOB elevated(for LEs)   General bed mobility comments: max x 2 to reposition upward in bed  Transfers                  General transfer comment: Unable to attempt due to dizziness post seated activity  Ambulation/Gait                 Stairs             Wheelchair Mobility    Modified Rankin (Stroke Patients Only)       Balance   Sitting-balance support: Bilateral upper extremity supported;Feet supported Sitting balance-Leahy Scale: Fair(without BUE's poor with posterior lean)                                      Cognition Arousal/Alertness: Awake/alert Behavior During Therapy: WFL for tasks assessed/performed Overall Cognitive Status: Within Functional Limits for tasks assessed                                        Exercises General Exercises - Lower Extremity Ankle Circles/Pumps: AROM;Both;10 reps;Supine Quad Sets: Strengthening;Both;10 reps;Supine Gluteal Sets: Strengthening;Both;10 reps;Supine Long Arc Quad: AROM;Both;10 reps;Seated Heel Slides: AROM;Both;10 reps;Supine Hip ABduction/ADduction: AROM;Both;10 reps;Seated Straight Leg Raises: AAROM;Both;10 reps;Supine Hip Flexion/Marching: AROM;Both;10 reps;Seated Toe Raises: AROM;Both;10 reps;Seated Heel Raises: AROM;Both;10 reps;Seated    General Comments        Pertinent Vitals/Pain Pain Assessment: 0-10 Pain Score: 6  Pain Location: B feet and neck Pain Descriptors /  Indicators: Aching;Constant;Grimacing    Home Living                      Prior Function            PT Goals (current goals can now be found in the care plan section) Progress towards PT goals: Progressing toward goals(slowly)    Frequency    Min 2X/week      PT Plan Current plan remains appropriate    Co-evaluation              AM-PAC PT "6 Clicks" Daily Activity  Outcome Measure  Difficulty turning over in bed (including adjusting bedclothes, sheets and blankets)?: Unable Difficulty moving from lying on back to sitting on the side of the bed? : Unable Difficulty sitting down on  and standing up from a chair with arms (e.g., wheelchair, bedside commode, etc,.)?: Unable Help needed moving to and from a bed to chair (including a wheelchair)?: Total Help needed walking in hospital room?: Total Help needed climbing 3-5 steps with a railing? : Total 6 Click Score: 6    End of Session Equipment Utilized During Treatment: Oxygen Activity Tolerance: Patient limited by fatigue;Other (comment)(weakness, dizziness) Patient left: in bed;with call bell/phone within reach;with bed alarm set   PT Visit Diagnosis: Muscle weakness (generalized) (M62.81);Difficulty in walking, not elsewhere classified (R26.2);History of falling (Z91.81)     Time: 2130-8657 PT Time Calculation (min) (ACUTE ONLY): 30 min  Charges:  $Therapeutic Exercise: 8-22 mins $Therapeutic Activity: 8-22 mins                      Scot Dock, PTA 10/25/2017, 3:06 PM

## 2017-10-25 NOTE — Progress Notes (Signed)
ANTICOAGULATION CONSULT NOTE -  Pharmacy Consult for Warfarin  Indication: atrial fibrillation  Allergies  Allergen Reactions  . Morphine Rash    unknown  . Hydrocodone-Acetaminophen Rash    agitation    Patient Measurements: Height: 5\' 11"  (180.3 cm) Weight: 226 lb (102.5 kg) IBW/kg (Calculated) : 75.3 Heparin Dosing Weight:   Vital Signs: Temp: 97.6 F (36.4 C) (09/30 0627) Temp Source: Oral (09/30 0627) BP: 153/76 (09/30 0627) Pulse Rate: 103 (09/30 0627)  Labs: Recent Labs    10/23/17 0510 10/23/17 1205 10/24/17 0450 10/25/17 0303  HGB  --   --   --  10.2*  HCT  --   --   --  28.7*  PLT  --   --   --  227  LABPROT 18.9*  --  19.9* 24.8*  INR 1.60  --  1.71 2.26  CREATININE 1.21  --   --  1.08  CKTOTAL  --  262  --   --     Estimated Creatinine Clearance: 64.3 mL/min (by C-G formula based on SCr of 1.08 mg/dL).   Medical History: Past Medical History:  Diagnosis Date  . A-fib (HCC)   . MI (myocardial infarction) (HCC)    x6    Medications:  Medications Prior to Admission  Medication Sig Dispense Refill Last Dose  . allopurinol (ZYLOPRIM) 100 MG tablet Take 1 tablet by mouth daily.   10/19/2017 at Unknown time  . atorvastatin (LIPITOR) 40 MG tablet Take 40 mg by mouth daily.   10/19/2017 at Unknown time  . hydrochlorothiazide (HYDRODIURIL) 25 MG tablet Take 1 tablet by mouth daily.   10/19/2017 at Unknown time  . isosorbide mononitrate (IMDUR) 30 MG 24 hr tablet Take 1 tablet by mouth daily.   10/19/2017 at Unknown time  . nitroGLYCERIN (NITROSTAT) 0.4 MG SL tablet Place 0.4 mg under the tongue every 5 (five) minutes as needed for chest pain.   PRN at PRN  . pantoprazole (PROTONIX) 40 MG tablet Take 1 tablet by mouth daily.   10/19/2017 at Unknown time  . potassium chloride SA (K-DUR,KLOR-CON) 20 MEQ tablet Take 20 mEq by mouth 2 (two) times daily.   10/19/2017 at Unknown time  . tamsulosin (FLOMAX) 0.4 MG CAPS capsule Take 1 capsule by mouth 2 (two) times  daily.   10/19/2017 at Unknown time  . verapamil (CALAN) 80 MG tablet Take 80 mg by mouth 2 (two) times daily.   10/19/2017 at Unknown time  . warfarin (COUMADIN) 2.5 MG tablet Take 1.25 mg by mouth at bedtime.   10/18/2017 at Unknown time  . acetaminophen (TYLENOL) 325 MG tablet Take 1 tablet by mouth 3 (three) times daily as needed.   PRN at PRN    Assessment: 84 yom cc weakness with PMH AF on VKA, MI x 6, history of TIA. Sepsis (of unknown source) protocol was initiated due to leukocytosis and tachycardia. 9/25 PCT 0.24 and CXR had no noted abnormality. Patient also noted to have AKI and is being rehydrated.  INR was subtherapeutic on admission.   Home Warfarin dose= 1.25 mg daily  WARFARIN COURSE DATE INR DOSE  9/24 1.41 2 mg 9/25 1.33 2 mg 9/26 1.44 2.5 mg 9/27 1.48 2.5 mg 9/28 1.6 2.5 mg 9/29 1.71 3 mg 9/30  2.26   Goal of Therapy:  INR 2-3   Plan:  INR now therapeutic.  Will order warfarin 3mg  x 1 dose again tonight.  Continue to monitor INR daily while on antibiotics and  until INR therapeutic x 2. CBC ordered with AM labs.   Tayonna Bacha A, Pharm.D., BCPS Clinical Pharmacist 10/25/2017,9:04 AM

## 2017-10-25 NOTE — Clinical Social Work Note (Signed)
Patient is medically ready for discharge today. CSW notified patient's daughter Julian Hy [phone] of discharge to [facility] today. CSW also notified Verlon Au at [employer] [employer]. Verlon Au states that they have obtained [facility] authorization for patient. Patient will be transported by EMS. RN to call report and call for transport.   Ruthe Mannan MSW, [address] [phone]

## 2017-10-25 NOTE — Care Management Important Message (Signed)
Important Message  Patient Details  Name: Jeremiah James MRN: [id number] Date of Birth: Jun 15, 1933   Medicare Important Message Given:  Yes    [name] 10/25/2017, 12:09 PM

## 2017-10-25 NOTE — Progress Notes (Signed)
Called gave report to [facility] LPN at [facility], patient  is going to room 401

## 2017-11-14 ENCOUNTER — Encounter: Payer: Self-pay | Admitting: Emergency Medicine

## 2017-11-14 ENCOUNTER — Emergency Department: Payer: Medicare HMO

## 2017-11-14 ENCOUNTER — Inpatient Hospital Stay
Admission: EM | Admit: 2017-11-14 | Discharge: 2017-11-17 | DRG: 871 | Disposition: A | Discharge status: Home Health | Payer: Medicare HMO | Attending: Internal Medicine | Admitting: Internal Medicine

## 2017-11-14 ENCOUNTER — Other Ambulatory Visit: Payer: Self-pay

## 2017-11-14 DIAGNOSIS — Z885 Allergy status to narcotic agent status: Secondary | ICD-10-CM

## 2017-11-14 DIAGNOSIS — Z7901 Long term (current) use of anticoagulants: Secondary | ICD-10-CM

## 2017-11-14 DIAGNOSIS — I2510 Atherosclerotic heart disease of native coronary artery without angina pectoris: Secondary | ICD-10-CM | POA: Diagnosis present

## 2017-11-14 DIAGNOSIS — Z66 Do not resuscitate: Secondary | ICD-10-CM | POA: Diagnosis present

## 2017-11-14 DIAGNOSIS — I482 Chronic atrial fibrillation, unspecified: Secondary | ICD-10-CM | POA: Diagnosis present

## 2017-11-14 DIAGNOSIS — Z8673 Personal history of transient ischemic attack (TIA), and cerebral infarction without residual deficits: Secondary | ICD-10-CM | POA: Diagnosis not present

## 2017-11-14 DIAGNOSIS — B349 Viral infection, unspecified: Secondary | ICD-10-CM | POA: Diagnosis present

## 2017-11-14 DIAGNOSIS — R4182 Altered mental status, unspecified: Secondary | ICD-10-CM | POA: Diagnosis not present

## 2017-11-14 DIAGNOSIS — N289 Disorder of kidney and ureter, unspecified: Secondary | ICD-10-CM

## 2017-11-14 DIAGNOSIS — I340 Nonrheumatic mitral (valve) insufficiency: Secondary | ICD-10-CM | POA: Diagnosis not present

## 2017-11-14 DIAGNOSIS — A4189 Other specified sepsis: Secondary | ICD-10-CM | POA: Diagnosis not present

## 2017-11-14 DIAGNOSIS — A419 Sepsis, unspecified organism: Secondary | ICD-10-CM | POA: Diagnosis present

## 2017-11-14 DIAGNOSIS — Z955 Presence of coronary angioplasty implant and graft: Secondary | ICD-10-CM

## 2017-11-14 DIAGNOSIS — Z8249 Family history of ischemic heart disease and other diseases of the circulatory system: Secondary | ICD-10-CM

## 2017-11-14 DIAGNOSIS — I252 Old myocardial infarction: Secondary | ICD-10-CM

## 2017-11-14 DIAGNOSIS — G9341 Metabolic encephalopathy: Secondary | ICD-10-CM | POA: Diagnosis present

## 2017-11-14 DIAGNOSIS — Z87891 Personal history of nicotine dependence: Secondary | ICD-10-CM | POA: Diagnosis not present

## 2017-11-14 DIAGNOSIS — E860 Dehydration: Secondary | ICD-10-CM | POA: Diagnosis present

## 2017-11-14 DIAGNOSIS — N200 Calculus of kidney: Secondary | ICD-10-CM

## 2017-11-14 LAB — CBC WITH DIFFERENTIAL/PLATELET
ABS IMMATURE GRANULOCYTES: 0.05 10*3/uL (ref 0.00–0.07)
BASOS PCT: 0 %
Basophils Absolute: 0 10*3/uL (ref 0.0–0.1)
EOS ABS: 0 10*3/uL (ref 0.0–0.5)
Eosinophils Relative: 0 %
HCT: 35.2 % — ABNORMAL LOW (ref 39.0–52.0)
Hemoglobin: 11.7 g/dL — ABNORMAL LOW (ref 13.0–17.0)
IMMATURE GRANULOCYTES: 1 %
Lymphocytes Relative: 6 %
Lymphs Abs: 0.7 10*3/uL (ref 0.7–4.0)
MCH: 32.1 pg (ref 26.0–34.0)
MCHC: 33.2 g/dL (ref 30.0–36.0)
MCV: 96.4 fL (ref 80.0–100.0)
MONO ABS: 0.8 10*3/uL (ref 0.1–1.0)
MONOS PCT: 8 %
NEUTROS ABS: 8.7 10*3/uL — AB (ref 1.7–7.7)
NEUTROS PCT: 85 %
PLATELETS: 199 10*3/uL (ref 150–400)
RBC: 3.65 MIL/uL — AB (ref 4.22–5.81)
RDW: 13 % (ref 11.5–15.5)
WBC: 10.3 10*3/uL (ref 4.0–10.5)
nRBC: 0 % (ref 0.0–0.2)

## 2017-11-14 LAB — COMPREHENSIVE METABOLIC PANEL
ALK PHOS: 59 U/L (ref 38–126)
ALT: 15 U/L (ref 0–44)
ANION GAP: 11 (ref 5–15)
AST: 22 U/L (ref 15–41)
Albumin: 3.5 g/dL (ref 3.5–5.0)
BILIRUBIN TOTAL: 0.6 mg/dL (ref 0.3–1.2)
BUN: 19 mg/dL (ref 8–23)
CALCIUM: 8.5 mg/dL — AB (ref 8.9–10.3)
CO2: 25 mmol/L (ref 22–32)
Chloride: 100 mmol/L (ref 98–111)
Creatinine, Ser: 1.53 mg/dL — ABNORMAL HIGH (ref 0.61–1.24)
GFR calc Af Amer: 47 mL/min — ABNORMAL LOW (ref >60)
GFR, EST NON AFRICAN AMERICAN: 41 mL/min — AB (ref >60)
GLUCOSE: 126 mg/dL — AB (ref 70–99)
POTASSIUM: 3.6 mmol/L (ref 3.5–5.1)
Sodium: 136 mmol/L (ref 135–145)
TOTAL PROTEIN: 7.4 g/dL (ref 6.5–8.1)

## 2017-11-14 LAB — BRAIN NATRIURETIC PEPTIDE: B NATRIURETIC PEPTIDE 5: 261 pg/mL — AB (ref 0.0–100.0)

## 2017-11-14 LAB — URINALYSIS, COMPLETE (UACMP) WITH MICROSCOPIC
Bilirubin Urine: NEGATIVE
GLUCOSE, UA: NEGATIVE mg/dL
Ketones, ur: NEGATIVE mg/dL
Nitrite: NEGATIVE
SPECIFIC GRAVITY, URINE: 1.015 (ref 1.005–1.030)
SQUAMOUS EPITHELIAL / LPF: NONE SEEN (ref 0–5)
pH: 6.5 (ref 5.0–8.0)

## 2017-11-14 LAB — LACTIC ACID, PLASMA
LACTIC ACID, VENOUS: 2.9 mmol/L — AB (ref 0.5–1.9)
Lactic Acid, Venous: 1.2 mmol/L (ref 0.5–1.9)

## 2017-11-14 LAB — INFLUENZA PANEL BY PCR (TYPE A & B)
Influenza A By PCR: NEGATIVE
Influenza B By PCR: NEGATIVE

## 2017-11-14 LAB — PROTIME-INR
INR: 1.99
Prothrombin Time: 22.3 seconds — ABNORMAL HIGH (ref 11.4–15.2)

## 2017-11-14 LAB — TROPONIN I: Troponin I: <0.03 ng/mL (ref <0.03)

## 2017-11-14 MED ORDER — VERAPAMIL HCL 80 MG PO TABS
80.0000 mg | ORAL_TABLET | Freq: Two times a day (BID) | ORAL | Status: DC
  Administered 2017-11-15 – 2017-11-17 (×5): 80 mg via ORAL
  Filled 2017-11-14 (×7): qty 1

## 2017-11-14 MED ORDER — ONDANSETRON HCL 4 MG PO TABS: 4.0000 mg | ORAL_TABLET | Freq: Four times a day (QID) | ORAL | Status: DC | PRN

## 2017-11-14 MED ORDER — TAMSULOSIN HCL 0.4 MG PO CAPS
0.4000 mg | ORAL_CAPSULE | Freq: Two times a day (BID) | ORAL | Status: DC
  Administered 2017-11-14 – 2017-11-17 (×6): 0.4 mg via ORAL
  Filled 2017-11-14 (×6): qty 1

## 2017-11-14 MED ORDER — SODIUM CHLORIDE 0.9 % IV SOLN
2.0000 g | Freq: Once | INTRAVENOUS | Status: AC
  Administered 2017-11-14: 2 g via INTRAVENOUS
  Filled 2017-11-14: qty 2

## 2017-11-14 MED ORDER — METRONIDAZOLE IN NACL 5-0.79 MG/ML-% IV SOLN
500.0000 mg | Freq: Three times a day (TID) | INTRAVENOUS | Status: DC
  Administered 2017-11-14 – 2017-11-16 (×6): 500 mg via INTRAVENOUS
  Filled 2017-11-14 (×8): qty 100

## 2017-11-14 MED ORDER — PANTOPRAZOLE SODIUM 40 MG PO TBEC
40.0000 mg | DELAYED_RELEASE_TABLET | Freq: Every day | ORAL | Status: DC
  Administered 2017-11-15 – 2017-11-17 (×3): 40 mg via ORAL
  Filled 2017-11-14 (×3): qty 1

## 2017-11-14 MED ORDER — ONDANSETRON HCL 4 MG/2ML IJ SOLN: 4.0000 mg | Freq: Four times a day (QID) | INTRAMUSCULAR | Status: DC | PRN

## 2017-11-14 MED ORDER — ACETAMINOPHEN 500 MG PO TABS
1000.0000 mg | ORAL_TABLET | Freq: Once | ORAL | Status: AC
  Administered 2017-11-14: 1000 mg via ORAL
  Filled 2017-11-14: qty 2

## 2017-11-14 MED ORDER — ISOSORBIDE MONONITRATE ER 30 MG PO TB24
30.0000 mg | ORAL_TABLET | Freq: Every day | ORAL | Status: DC
  Administered 2017-11-15 – 2017-11-17 (×3): 30 mg via ORAL
  Filled 2017-11-14 (×3): qty 1

## 2017-11-14 MED ORDER — SODIUM CHLORIDE 0.9 % IV SOLN
2.0000 g | Freq: Two times a day (BID) | INTRAVENOUS | Status: DC
  Administered 2017-11-15 – 2017-11-16 (×3): 2 g via INTRAVENOUS
  Filled 2017-11-14 (×4): qty 2

## 2017-11-14 MED ORDER — SODIUM CHLORIDE 0.9 % IV SOLN
INTRAVENOUS | Status: DC
  Administered 2017-11-14 – 2017-11-16 (×3): via INTRAVENOUS

## 2017-11-14 MED ORDER — ACETAMINOPHEN 325 MG PO TABS
650.0000 mg | ORAL_TABLET | Freq: Four times a day (QID) | ORAL | Status: DC | PRN
  Administered 2017-11-14 – 2017-11-17 (×6): 650 mg via ORAL
  Filled 2017-11-14 (×6): qty 2

## 2017-11-14 MED ORDER — SENNOSIDES-DOCUSATE SODIUM 8.6-50 MG PO TABS: 1.0000 | ORAL_TABLET | Freq: Every evening | ORAL | Status: DC | PRN

## 2017-11-14 MED ORDER — WARFARIN SODIUM 2.5 MG PO TABS
2.5000 mg | ORAL_TABLET | Freq: Every day | ORAL | Status: DC
  Administered 2017-11-14 – 2017-11-16 (×3): 2.5 mg via ORAL
  Filled 2017-11-14 (×4): qty 1

## 2017-11-14 MED ORDER — VANCOMYCIN HCL 10 G IV SOLR
1250.0000 mg | INTRAVENOUS | Status: DC
  Administered 2017-11-15: 1250 mg via INTRAVENOUS
  Filled 2017-11-14: qty 1250

## 2017-11-14 MED ORDER — ACETAMINOPHEN 650 MG RE SUPP: 650.0000 mg | Freq: Four times a day (QID) | RECTAL | Status: DC | PRN

## 2017-11-14 MED ORDER — SODIUM CHLORIDE 0.9 % IV BOLUS
1000.0000 mL | Freq: Once | INTRAVENOUS | Status: AC
  Administered 2017-11-14: 1000 mL via INTRAVENOUS

## 2017-11-14 MED ORDER — VANCOMYCIN HCL IN DEXTROSE 1-5 GM/200ML-% IV SOLN
1000.0000 mg | Freq: Once | INTRAVENOUS | Status: AC
  Administered 2017-11-14: 1000 mg via INTRAVENOUS
  Filled 2017-11-14: qty 200

## 2017-11-14 MED ORDER — NITROGLYCERIN 0.4 MG SL SUBL: 0.4000 mg | SUBLINGUAL_TABLET | SUBLINGUAL | Status: DC | PRN

## 2017-11-14 MED ORDER — ATORVASTATIN CALCIUM 20 MG PO TABS
40.0000 mg | ORAL_TABLET | Freq: Every day | ORAL | Status: DC
  Administered 2017-11-15 – 2017-11-17 (×3): 40 mg via ORAL
  Filled 2017-11-14 (×3): qty 2

## 2017-11-14 NOTE — Progress Notes (Signed)
Pharmacy Antibiotic Note  Jeremiah James is a 84 y.o. male admitted on 11/14/2017 with sepsis.  Pharmacy has been consulted for Vancomcyin and Cefepime dosing.  Plan: Vancomycin 1250mg  IV q24h with a 12 hour stacked dose. Will check a trough level prior to 5th dose. Cefepime 2g IV q12h  Weight: 190 lb 3.2 oz (86.3 kg)  Temp (24hrs), Avg:100.5 F (38.1 C), Min:98.6 F (37 C), Max:102.3 F (39.1 C)  Recent Labs  Lab 11/14/17 1541 11/14/17 1741  WBC 10.3  --   CREATININE 1.53*  --   LATICACIDVEN  --  2.9*    Estimated Creatinine Clearance: 39.6 mL/min (A) (by C-G formula based on SCr of 1.53 mg/dL (H)).    Allergies  Allergen Reactions  . Morphine Rash    unknown  . Hydrocodone-Acetaminophen Rash    agitation    Antimicrobials this admission: Vancomycin 10/20 >>  Cefepime 10/20 >>  Metronidazole 10/20 >>  Thank you for allowing pharmacy to be a part of this patient's care.  Clovia Cuff, PharmD, BCPS 11/14/2017 6:33 PM

## 2017-11-14 NOTE — ED Triage Notes (Signed)
Pt to ED via EMS from home with c/o mechanical fall this am from increased weakness. Denies any head injury, pt takes warfrin.  PT recently d/c form [facility] for rehab and recent dx with c-diff. PT states increased weakness xfew days. Denies pain or diarrhea. MD at bedside .

## 2017-11-14 NOTE — Progress Notes (Signed)
CODE SEPSIS - PHARMACY COMMUNICATION  **Broad Spectrum Antibiotics should be administered within 1 hour of Sepsis diagnosis**  Time Code Sepsis Called/Page Received: 1543  Antibiotics Ordered: cefepime/vancomycin/metronidazole  Time of 1st antibiotic administration: 1638  Additional action taken by pharmacy: spoke with RN  If necessary, Name of Provider/Nurse Contacted: [facility]  Pricilla Riffle, PharmD Pharmacy Resident  11/14/2017 4:48 PM

## 2017-11-14 NOTE — ED Notes (Signed)
X-ray at bedside

## 2017-11-14 NOTE — ED Provider Notes (Signed)
[facility] Emergency Department Provider Note  ___________________________________________   First MD Initiated Contact with Patient 11/14/17 1540     (approximate)  I have reviewed the triage vital signs and the nursing notes.   HISTORY  Chief Complaint Weakness  HPI Jeremiah James is a 84 y.o. male history of atrial fibrillation on Coumadin who was presented to the emergency department with generalized weakness.  He says that he was at rehab as an inpatient until approximately 1 week ago.  Was discharged and says that he has felt weak ever since.  Says that the weakness has been worsening.  Says that he fell today, slipping off a chair onto his bottom but did not hit his head.  Denies any pain at this time.  Denies any diarrhea, nausea or vomiting.  Denies any shortness of breath or cough.  Past Medical History:  Diagnosis Date  . A-fib (HCC)   . MI (myocardial infarction) [facility])    x6    Patient Active Problem List   Diagnosis Date Noted  . Sepsis (HCC) 10/19/2017  . Transient ischemic attack 12/03/2012  . S/P coronary artery stent placement 12/03/2012  . Atrial fibrillation (HCC) 06/14/2012  . 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 Other dyspnea and respiratory abnormality 06/14/2012  . Coronary artery disease involving native coronary artery of native heart without angina pectoris 05/05/2012  . Intermediate coronary syndrome (HCC) 05/05/2012  . Family history of ischemic heart disease (IHD) 11/26/2010    History reviewed. No pertinent surgical history.  Prior to Admission medications   Medication Sig Start Date End Date Taking? Authorizing Provider  acetaminophen (TYLENOL) 325 MG tablet Take 1 tablet by mouth 3 (three) times daily as needed.    [provider]  allopurinol (ZYLOPRIM) 100 MG tablet Take 1 tablet by mouth daily.    [provider]  atorvastatin (LIPITOR) 40 MG tablet Take 40 mg by  mouth daily.    [provider]  benzonatate (TESSALON) 100 MG capsule Take 1 capsule (100 mg total) by mouth 3 (three) times daily. 10/25/17   Enid Baas, MD  furosemide (LASIX) 20 MG tablet Take 1 tablet (20 mg total) by mouth daily. 10/25/17 12/24/17  Enid Baas, MD  isosorbide mononitrate (IMDUR) 30 MG 24 hr tablet Take 1 tablet by mouth daily. 08/17/12   [provider]  nitroGLYCERIN (NITROSTAT) 0.4 MG SL tablet Place 0.4 mg under the tongue every 5 (five) minutes as needed for chest pain.    [provider]  pantoprazole (PROTONIX) 40 MG tablet Take 1 tablet by mouth daily. 03/21/12   [provider]  potassium chloride SA (K-DUR,KLOR-CON) 20 MEQ tablet Take 1 tablet (20 mEq total) by mouth daily. 10/25/17   Enid Baas, MD  tamsulosin (FLOMAX) 0.4 MG CAPS capsule Take 1 capsule by mouth 2 (two) times daily.    [provider]  verapamil (CALAN) 80 MG tablet Take 80 mg by mouth 2 (two) times daily.    [provider]  warfarin (COUMADIN) 2.5 MG tablet Take 1 tablet (2.5 mg total) by mouth at bedtime. 10/25/17   Enid Baas, MD    Allergies Morphine and Hydrocodone-acetaminophen  No family history on file.  Social History Social History   Tobacco Use  . Smoking status: Former Smoker    Last attempt to quit: 2004    Years since quitting: 15.8  . Smokeless tobacco: Never Used  Substance Use Topics  . Alcohol use:  Never    Frequency: Never  . Drug use: Never    Review of Systems  Constitutional: Axillary temp of 100.8 per EMS. Eyes: No visual changes. ENT: No sore throat. Cardiovascular: Denies chest pain. Respiratory: Denies shortness of breath. Gastrointestinal: No abdominal pain.  No nausea, no vomiting.  No diarrhea.  No constipation. Genitourinary: Negative for dysuria. Musculoskeletal: Negative for back pain. Skin: Negative for rash. Neurological: Negative for headaches, focal weakness or  numbness.   ____________________________________________   PHYSICAL EXAM:  VITAL SIGNS: ED Triage Vitals  Enc Vitals Group     BP 11/14/17 1536 (!) 143/65     Pulse Rate 11/14/17 1536 (!) 108     Resp 11/14/17 1536 (!) 22     Temp 11/14/17 1536 98.6 F (37 C)     Temp Source 11/14/17 1536 Oral     SpO2 11/14/17 1536 100 %     Weight 11/14/17 1537 190 lb 3.2 oz (86.3 kg)     Height --      Head Circumference --      Peak Flow --      Pain Score 11/14/17 1537 0     Pain Loc --      Pain Edu? --      Excl. in GC? --     Constitutional: Alert and oriented to self as well as location but not to the year.  No distress. Eyes: Conjunctivae are normal.  Head: Atraumatic. Nose: No congestion/rhinnorhea. Mouth/Throat: Mucous membranes are moist.  Neck: No stridor.   Cardiovascular: Tachycardic with an irregularly irregular rhythm. Grossly normal heart sounds.   Respiratory: Normal respiratory effort.  No retractions. Lungs CTAB. Gastrointestinal: Soft and nontender. No distention.  Musculoskeletal: No lower extremity tenderness nor edema.  No joint effusions. Neurologic:  Normal speech and language. No gross focal neurologic deficits are appreciated. Skin:  Skin is warm, dry and intact. No rash noted. Psychiatric: Mood and affect are normal. Speech and behavior are normal.  ____________________________________________   LABS (all labs ordered are listed, but only abnormal results are displayed)  Labs Reviewed  COMPREHENSIVE METABOLIC PANEL - Abnormal; Notable for the following components:      Result Value   Glucose, Bld 126 (*)    Creatinine, Ser 1.53 (*)    Calcium 8.5 (*)    GFR calc non Af Amer 41 (*)    GFR calc Af Amer 47 (*)    All other components within normal limits  CBC WITH DIFFERENTIAL/PLATELET - Abnormal; Notable for the following components:   RBC 3.65 (*)    Hemoglobin 11.7 (*)    HCT 35.2 (*)    Neutro Abs 8.7 (*)    All other components within  normal limits  LACTIC ACID, PLASMA - Abnormal; Notable for the following components:   Lactic Acid, Venous 2.9 (*)    All other components within normal limits  PROTIME-INR - Abnormal; Notable for the following components:   Prothrombin Time 22.3 (*)    All other components within normal limits  BRAIN NATRIURETIC PEPTIDE - Abnormal; Notable for the following components:   B Natriuretic Peptide 261.0 (*)    All other components within normal limits  CULTURE, BLOOD (ROUTINE X 2)  CULTURE, BLOOD (ROUTINE X 2)  URINE CULTURE  TROPONIN I  URINALYSIS, COMPLETE (UACMP) WITH MICROSCOPIC  INFLUENZA PANEL BY PCR (TYPE A & B)  LACTIC ACID, PLASMA   ____________________________________________  EKG  ED ECG REPORT I, Arelia Longest, the attending physician,  personally viewed and interpreted this ECG.   Date: 11/14/2017  EKG Time: 1543  Rate: 101  Rhythm: atrial fibrillation, rate 101  Axis: Normal  Intervals:LVH with secondary repolarization abnormality  ST&T Change: No ST segment elevation or depression.  No abnormal T wave inversion.  ____________________________________________  RADIOLOGY  No acute finding on the chest x-ray nor the head CT ____________________________________________   PROCEDURES  Procedure(s) performed:   .Critical Care Performed by: Myrna Blazer, MD Authorized by: Myrna Blazer, MD   Critical care provider statement:    Critical care time (minutes):  35   Critical care time was exclusive of:  Separately billable procedures and treating other patients   Critical care was necessary to treat or prevent imminent or life-threatening deterioration of the following conditions:  Sepsis   Critical care was time spent personally by me on the following activities:  Development of treatment plan with patient or surrogate, discussions with consultants, evaluation of patient's response to treatment, examination of patient, obtaining history  from patient or surrogate, ordering and performing treatments and interventions, ordering and review of laboratory studies, ordering and review of radiographic studies, pulse oximetry, re-evaluation of patient's condition and review of old charts   Critical Care performed:   ____________________________________________   INITIAL IMPRESSION / ASSESSMENT AND PLAN / ED COURSE  Pertinent labs & imaging results that were available during my care of the patient were reviewed by me and considered in my medical decision making (see chart for details).  Differential diagnosis includes, but is not limited to, alcohol, illicit or prescription medications, or other toxic ingestion; intracranial pathology such as stroke or intracerebral hemorrhage; fever or infectious causes including sepsis; hypoxemia and/or hypercarbia; uremia; trauma; endocrine related disorders such as diabetes, hypoglycemia, and thyroid-related diseases; hypertensive encephalopathy; etc. As part of my medical decision making, I reviewed the following data within the electronic MEDICAL RECORD NUMBER Notes from prior ED visits  ----------------------------------------- 5:14 PM on 11/14/2017 -----------------------------------------  No obvious source at this time.  Sinus disease present but would be unusual for this to be systemic and causing the patient's current constellation of symptoms.  Urine pending.  Patient be admitted to the hospital.  Broad-spectrum antibiotic shoulder.  Signed out to Dr. Tobi Bastos.  Patient and family understanding of the diagnosis as well as treatment plan willing to comply.  Patient also here approximately 1 month ago with similar issue without source identified per family.  Thought to be UTI bronchitis at the time of her discharge summary. ____________________________________________   FINAL CLINICAL IMPRESSION(S) / ED DIAGNOSES  Sepsis.  Altered mental status.  NEW MEDICATIONS STARTED DURING THIS  VISIT:  New Prescriptions   No medications on file     Note:  This document was prepared using Dragon voice recognition software and may include unintentional dictation errors.     Myrna Blazer, MD 11/14/17 [phone]

## 2017-11-14 NOTE — Progress Notes (Signed)
Advanced care plan.  Purpose of the Encounter: CODE STATUS  Parties in Attendance: Patient  Patient's Decision Capacity: Good  Subjective/Patient's story: Presented to emergency room for fever   Objective/Medical story Patient has high-grade fever, tachycardia Appears septic Needs work-up and IV antibiotics   Goals of care determination:  Advance care directives and goals of care discussed Patient does not want any CPR, intubation ventilator if the need arises   CODE STATUS: DNR   Time spent discussing advanced care planning: 16 minutes

## 2017-11-14 NOTE — H&P (Signed)
[facility] - [place] at [facility]   PATIENT NAME: Jeremiah James    MR#:  [id number]  DATE OF BIRTH:  [birth year]  DATE OF ADMISSION:  11/14/2017  PRIMARY CARE PHYSICIAN: Shawnee Knapp, MD   REQUESTING/REFERRING PHYSICIAN:   CHIEF COMPLAINT:   Chief Complaint  Patient presents with  . Weakness    HISTORY OF PRESENT ILLNESS: Chidi Shirer  is a 84 y.o. male with a known history of atrial fibrillation on Coumadin for anticoagulation, myocardial infarction in the past presented to the emergency room for fever and weakness.  Patient had a fever of 102.3 F.  Recently has been discharged from rehab to home.  He is alert and awake and responds to all verbal commands no.  Complaints of any cough.  Patient was worked up with chest x-ray which showed no pneumonia.  Urinalysis is pending.  He was tachycardic in the emergency room had elevated lactic acid.  Code sepsis was called patient was started on broad-spectrum IV antibiotics.  He was also worked up with CT head which showed no acute abnormality. Hospitalist service was consulted.  PAST MEDICAL HISTORY:   Past Medical History:  Diagnosis Date  . A-fib (HCC)   . MI (myocardial infarction) (HCC)    x6    PAST SURGICAL HISTORY: History reviewed. No pertinent surgical history.  SOCIAL HISTORY:  Social History   Tobacco Use  . Smoking status: Former Smoker    Last attempt to quit: 2004    Years since quitting: 15.8  . Smokeless tobacco: Never Used  Substance Use Topics  . Alcohol use: Never    Frequency: Never    FAMILY HISTORY: No family history on file.  DRUG ALLERGIES:  Allergies  Allergen Reactions  . Morphine Rash    unknown  . Hydrocodone-Acetaminophen Rash    agitation    REVIEW OF SYSTEMS:   CONSTITUTIONAL: Has fever, has fatigue and weakness.  EYES: No blurred or double vision.  EARS, NOSE, AND THROAT: No tinnitus or ear pain.  RESPIRATORY: No cough, shortness of breath, wheezing or  hemoptysis.  CARDIOVASCULAR: No chest pain, orthopnea, edema.  GASTROINTESTINAL: No nausea, vomiting, diarrhea or abdominal pain.  GENITOURINARY: No dysuria, hematuria.  ENDOCRINE: No polyuria, nocturia,  HEMATOLOGY: No anemia, easy bruising or bleeding SKIN: No rash or lesion. MUSCULOSKELETAL: No joint pain or arthritis.   NEUROLOGIC: No tingling, numbness, weakness.  PSYCHIATRY: No anxiety or depression.   MEDICATIONS AT HOME:  Prior to Admission medications   Medication Sig Start Date End Date Taking? Authorizing Provider  acetaminophen (TYLENOL) 325 MG tablet Take 1 tablet by mouth 3 (three) times daily as needed.    [provider]  allopurinol (ZYLOPRIM) 100 MG tablet Take 1 tablet by mouth daily.    [provider]  benzonatate (TESSALON) 100 MG capsule Take 1 capsule (100 mg total) by mouth 3 (three) times daily. 10/25/17   Enid Baas, MD  furosemide (LASIX) 20 MG tablet Take 1 tablet (20 mg total) by mouth daily. 10/25/17 12/24/17  Enid Baas, MD  isosorbide mononitrate (IMDUR) 30 MG 24 hr tablet Take 1 tablet by mouth daily. 08/17/12   [provider]  nitroGLYCERIN (NITROSTAT) 0.4 MG SL tablet Place 0.4 mg under the tongue every 5 (five) minutes as needed for chest pain.    [provider]  pantoprazole (PROTONIX) 40 MG tablet Take 1 tablet by mouth daily. 03/21/12   [provider]  potassium chloride SA (K-DUR,KLOR-CON) 20 MEQ tablet Take  1 tablet (20 mEq total) by mouth daily. 10/25/17   Enid Baas, MD  tamsulosin (FLOMAX) 0.4 MG CAPS capsule Take 1 capsule by mouth 2 (two) times daily.    [provider]  verapamil (CALAN) 80 MG tablet Take 80 mg by mouth 2 (two) times daily.    [provider]  warfarin (COUMADIN) 2.5 MG tablet Take 1 tablet (2.5 mg total) by mouth at bedtime. 10/25/17   Enid Baas, MD      PHYSICAL EXAMINATION:   VITAL SIGNS: Blood pressure (!) 143/65, pulse (!)  108, temperature (!) 102.3 F (39.1 C), temperature source Rectal, resp. rate (!) 22, weight 86.3 kg, SpO2 100 %.  GENERAL:  84 y.o.-year-old patient lying in the bed with no acute distress.  EYES: Pupils equal, round, reactive to light and accommodation. No scleral icterus. Extraocular muscles intact.  HEENT: Head atraumatic, normocephalic. Oropharynx and nasopharynx clear.  NECK:  Supple, no jugular venous distention. No thyroid enlargement, no tenderness.  LUNGS: Normal breath sounds bilaterally, no wheezing, rales,rhonchi or crepitation. No use of accessory muscles of respiration.  CARDIOVASCULAR: S1, S2 tachycardia. No murmurs, rubs, or gallops.  ABDOMEN: Soft, nontender, nondistended. Bowel sounds present. No organomegaly or mass.  EXTREMITIES: No pedal edema, cyanosis, or clubbing.  NEUROLOGIC: Cranial nerves II through XII are intact. Muscle strength 5/5 in all extremities. Sensation intact. Gait not checked.  PSYCHIATRIC: The patient is alert and oriented x 3.  SKIN: No obvious rash, lesion, or ulcer.   LABORATORY PANEL:   CBC Recent Labs  Lab 11/14/17 1541  WBC 10.3  HGB 11.7*  HCT 35.2*  PLT 199  MCV 96.4  MCH 32.1  MCHC 33.2  RDW 13.0  LYMPHSABS 0.7  MONOABS 0.8  EOSABS 0.0  BASOSABS 0.0   ------------------------------------------------------------------------------------------------------------------  Chemistries  Recent Labs  Lab 11/14/17 1541  NA 136  K 3.6  CL 100  CO2 25  GLUCOSE 126*  BUN 19  CREATININE 1.53*  CALCIUM 8.5*  AST 22  ALT 15  ALKPHOS 59  BILITOT 0.6   ------------------------------------------------------------------------------------------------------------------ estimated creatinine clearance is 39.6 mL/min (A) (by C-G formula based on SCr of 1.53 mg/dL (H)). ------------------------------------------------------------------------------------------------------------------ No results for input(s): TSH, T4TOTAL, T3FREE,  THYROIDAB in the last 72 hours.  Invalid input(s): FREET3   Coagulation profile Recent Labs  Lab 11/14/17 1541  INR 1.99   ------------------------------------------------------------------------------------------------------------------- No results for input(s): DDIMER in the last 72 hours. -------------------------------------------------------------------------------------------------------------------  Cardiac Enzymes Recent Labs  Lab 11/14/17 1541  TROPONINI <0.03   ------------------------------------------------------------------------------------------------------------------ Invalid input(s): POCBNP  ---------------------------------------------------------------------------------------------------------------  Urinalysis    Component Value Date/Time   COLORURINE YELLOW (A) 10/19/2017 1926   APPEARANCEUR CLOUDY (A) 10/19/2017 1926   LABSPEC 1.009 10/19/2017 1926   PHURINE 5.0 10/19/2017 1926   GLUCOSEU NEGATIVE 10/19/2017 1926   HGBUR LARGE (A) 10/19/2017 1926   BILIRUBINUR NEGATIVE 10/19/2017 1926   KETONESUR NEGATIVE 10/19/2017 1926   PROTEINUR NEGATIVE 10/19/2017 1926   NITRITE NEGATIVE 10/19/2017 1926   LEUKOCYTESUR NEGATIVE 10/19/2017 1926     RADIOLOGY: Ct Head Wo Contrast  Result Date: 11/14/2017 CLINICAL DATA:  Weakness.  Status post fall this morning. EXAM: CT HEAD WITHOUT CONTRAST TECHNIQUE: Contiguous axial images were obtained from the base of the skull through the vertex without intravenous contrast. COMPARISON:  None. FINDINGS: Brain: No evidence of acute infarction, hemorrhage, hydrocephalus, extra-axial collection or mass lesion/mass effect. Atrophy and extensive chronic microvascular ischemic change are noted. Vascular: Extensive atherosclerosis is seen. Skull: Intact.  No focal lesion. Sinuses/Orbits:  The patient has chronic right maxillary sinus disease. The walls of the sinus are thickened and secretions within the sinus are slightly  hyperattenuating. Small right mastoid effusion noted. Other: None. IMPRESSION: No acute abnormality. Extensive chronic microvascular ischemic change and mild atrophy. Atherosclerosis. Chronic right maxillary sinus disease. Very small right mastoid effusion also noted. Electronically Signed   By: Drusilla Kanner M.D.   On: 11/14/2017 16:38   Dg Chest Port 1 View  Result Date: 11/14/2017 CLINICAL DATA:  Status post fall.  Weakness. EXAM: PORTABLE CHEST 1 VIEW COMPARISON:  10/25/2017 FINDINGS: Cardiomediastinal silhouette is normal. Mediastinal contours appear intact. Tortuosity and calcific atherosclerotic disease of aorta. There is no evidence of focal airspace consolidation, pleural effusion or pneumothorax. Osseous structures are without acute abnormality. Soft tissues are grossly normal. IMPRESSION: No active disease. Tortuosity and calcific atherosclerotic disease of the aorta. Electronically Signed   By: Ted Mcalpine M.D.   On: 11/14/2017 16:22    EKG: Orders placed or performed during the hospital encounter of 11/14/17  . ED EKG 12-Lead  . ED EKG 12-Lead  . EKG 12-Lead  . EKG 12-Lead    IMPRESSION AND PLAN:  84 year old elderly male patient with history of atrial fibrillation on Coumadin, myocardial infarction in the past presented to the emergency room for weakness and fever  -Sepsis Start patient on broad-spectrum IV antibiotics that is IV vancomycin and IV cefepime Follow-up cultures IV fluids Follow-up lactic acid Check urinalysis to rule out infection  -Atrial fibrillation Resume verapamil for rate control Resume Coumadin for anticoagulation Follow-up INR  -History of myocardial infarction Resume statin medication  -DVT prophylaxis On anticoagulation with Coumadin  All the records are reviewed and case discussed with ED provider. Management plans discussed with the patient, family and they are in agreement.  CODE STATUS:DNR Code Status History    Date  Active Date Inactive Code Status Order ID Comments User Context   10/23/2017 0917 10/25/2017 1904 DNR [id number]  Adrian Saran, MD Inpatient   10/19/2017 2151 10/23/2017 0917 Full Code [id number]  Ramonita Lab, MD Inpatient    Questions for Most Recent Historical Code Status (Order [id number])    Question Answer Comment   In the event of cardiac or respiratory ARREST Do not call a "code blue"    In the event of cardiac or respiratory ARREST Do not perform Intubation, CPR, defibrillation or ACLS    In the event of cardiac or respiratory ARREST Use medication by any route, position, wound care, and other measures to relive pain and suffering. May use oxygen, suction and manual treatment of airway obstruction as needed for comfort.        TOTAL TIME TAKING CARE OF THIS PATIENT: 52 minutes.    Ihor Austin M.D on 11/14/2017 at 5:22 PM  Between 7am to 6pm - Pager - [phone]  After 6pm go to [email or web address] - password [email or web address] [facility]  [place] [facility]  Office  [phone]  CC: Primary care physician; Shawnee Knapp, MD

## 2017-11-15 LAB — CBC
HCT: 32.2 % — ABNORMAL LOW (ref 39.0–52.0)
Hemoglobin: 10.3 g/dL — ABNORMAL LOW (ref 13.0–17.0)
MCH: 30.7 pg (ref 26.0–34.0)
MCHC: 32 g/dL (ref 30.0–36.0)
MCV: 96.1 fL (ref 80.0–100.0)
PLATELETS: 161 10*3/uL (ref 150–400)
RBC: 3.35 MIL/uL — AB (ref 4.22–5.81)
RDW: 12.8 % (ref 11.5–15.5)
WBC: 6.6 10*3/uL (ref 4.0–10.5)
nRBC: 0 % (ref 0.0–0.2)

## 2017-11-15 LAB — PROTIME-INR
INR: 2.06
PROTHROMBIN TIME: 22.9 s — AB (ref 11.4–15.2)

## 2017-11-15 LAB — BASIC METABOLIC PANEL
ANION GAP: 8 (ref 5–15)
BUN: 16 mg/dL (ref 8–23)
CALCIUM: 7.8 mg/dL — AB (ref 8.9–10.3)
CO2: 25 mmol/L (ref 22–32)
Chloride: 106 mmol/L (ref 98–111)
Creatinine, Ser: 1.3 mg/dL — ABNORMAL HIGH (ref 0.61–1.24)
GFR calc Af Amer: 57 mL/min — ABNORMAL LOW (ref >60)
GFR, EST NON AFRICAN AMERICAN: 49 mL/min — AB (ref >60)
GLUCOSE: 95 mg/dL (ref 70–99)
Potassium: 3.5 mmol/L (ref 3.5–5.1)
SODIUM: 139 mmol/L (ref 135–145)

## 2017-11-15 MED ORDER — POTASSIUM CHLORIDE CRYS ER 20 MEQ PO TBCR
20.0000 meq | EXTENDED_RELEASE_TABLET | Freq: Once | ORAL | Status: AC
  Administered 2017-11-15: 20 meq via ORAL
  Filled 2017-11-15: qty 1

## 2017-11-15 NOTE — Evaluation (Signed)
Physical Therapy Evaluation Patient Details Name: Jeremiah James MRN: [id number] DOB: [birth year] Today's Date: 11/15/2017   History of Present Illness  Pt is a 84 y/o M who presented with fever and weakness.  Chest x-ray showed no pneumonia, urinalysis is pending. Pt did have a fall prior to presenting to hospital.  CT head with no acute abnormality. Pt admitted for sepsis.  Pt's PMH includes MI, a-fib,     Clinical Impression  Pt admitted with above diagnosis. Pt currently with functional limitations due to the deficits listed below (see PT Problem List). Jeremiah James presents at his baseline level of mobility.  He required min assist due to LOB with initial sit>stand from bed but then progressed to min guard with repeat sit>stand x2.  Pt requires cues for safe RW management when ambulating. Pt will benefit from skilled PT to increase their independence and safety with mobility to allow discharge to the venue listed below.      Follow Up Recommendations Home health PT    Equipment Recommendations  None recommended by PT    Recommendations for Other Services       Precautions / Restrictions Precautions Precautions: Fall Restrictions Weight Bearing Restrictions: No      Mobility  Bed Mobility Overal bed mobility: Needs Assistance Bed Mobility: Supine to Sit     Supine to sit: Supervision;HOB elevated     General bed mobility comments: Pt uses bed rail to pull to sitting but does not require assist or cues  Transfers Overall transfer level: Needs assistance Equipment used: Rolling walker (2 wheeled) Transfers: Sit to/from Stand Sit to Stand: Min assist;Min guard         General transfer comment: On pt's first sit>stand he loses his balance posteriorly and requires assist for controlled descent to sit EOB.  Pt with mild instability on next two sit>stand with min guard assist for safety.   Ambulation/Gait Ambulation/Gait assistance: Min guard Gait Distance (Feet): 140  Feet Assistive device: Rolling walker (2 wheeled) Gait Pattern/deviations: Decreased stride length;Decreased step length - right;Decreased step length - left;Trunk flexed     General Gait Details: Pt pushes RW too far ahead and requires fequent cues for proper RW positioning.  Pt remains steady while ambulating with RW.   Stairs            Wheelchair Mobility    Modified Rankin (Stroke Patients Only)       Balance Overall balance assessment: Needs assistance;History of Falls Sitting-balance support: No upper extremity supported;Feet supported Sitting balance-Leahy Scale: Good     Standing balance support: Single extremity supported;During functional activity Standing balance-Leahy Scale: Poor Standing balance comment: Pt relies on at least 1UE support for static and dynamic activities     Tandem Stance - Right Leg: 0   Rhomberg - Eyes Opened: 30(increased sway) Rhomberg - Eyes Closed: 30(increased sway)   High Level Balance Comments: Balance tested on level ground and eyes closed with increased sway but no LOB             Pertinent Vitals/Pain Pain Assessment: 0-10 Pain Score: 5  Pain Location: Pt reports very light ache in Bil flank from fall Pain Descriptors / Indicators: Discomfort Pain Intervention(s): Limited activity within patient's tolerance;Monitored during session    Home Living Family/patient expects to be discharged to:: Private residence Living Arrangements: Alone Available Help at Discharge: Family;Available PRN/intermittently(daughter and son in law live nearby) Type of Home: Independent living facility Home Access: Level entry     Home  Layout: One level Home Equipment: Walker - 2 wheels;Walker - 4 wheels;Grab bars - tub/shower;Shower seat      Prior Function Level of Independence: Independent with assistive device(s)   Gait / Transfers Assistance Needed: Pt reports 3 falls in the past 6 months.    ADL's / Homemaking Assistance Needed:  Daughter does the cooking, grocery shopping.  Pt no longer driving.  Pt ind with bathing, dressing.         Hand Dominance        Extremity/Trunk Assessment   Upper Extremity Assessment Upper Extremity Assessment: Overall WFL for tasks assessed    Lower Extremity Assessment Lower Extremity Assessment: (BLE strength grossly 4-/5)       Communication   Communication: No difficulties  Cognition Arousal/Alertness: Awake/alert Behavior During Therapy: WFL for tasks assessed/performed Overall Cognitive Status: Within Functional Limits for tasks assessed                                        General Comments General comments (skin integrity, edema, etc.): SpO2 remains at or above 97% on RA throughout session     Exercises General Exercises - Lower Extremity Ankle Circles/Pumps: AROM;Both;10 reps;Supine Other Exercises Other Exercises: Encouraged pt to ambulate at least 3x/day with nursing staff   Assessment/Plan    PT Assessment Patient needs continued PT services  PT Problem List Decreased balance;Decreased strength;Decreased knowledge of use of DME;Decreased safety awareness       PT Treatment Interventions DME instruction;Gait training;Stair training;Functional mobility training;Therapeutic activities;Therapeutic exercise;Balance training;Neuromuscular re-education;Patient/family education    PT Goals (Current goals can be found in the Care Plan section)  Acute Rehab PT Goals Patient Stated Goal: to go home PT Goal Formulation: With patient Time For Goal Achievement: 11/29/17 Potential to Achieve Goals: Good    Frequency Min 2X/week   Barriers to discharge        Co-evaluation               AM-PAC PT "6 Clicks" Daily Activity  Outcome Measure Difficulty turning over in bed (including adjusting bedclothes, sheets and blankets)?: A Little Difficulty moving from lying on back to sitting on the side of the bed? : Unable Difficulty  sitting down on and standing up from a chair with arms (e.g., wheelchair, bedside commode, etc,.)?: Unable Help needed moving to and from a bed to chair (including a wheelchair)?: A Little Help needed walking in hospital room?: A Little Help needed climbing 3-5 steps with a railing? : A Little 6 Click Score: 14    End of Session Equipment Utilized During Treatment: Gait belt Activity Tolerance: Patient tolerated treatment well Patient left: in chair;with call bell/phone within reach;with chair alarm set Nurse Communication: Mobility status PT Visit Diagnosis: Muscle weakness (generalized) (M62.81);Other abnormalities of gait and mobility (R26.89);Unsteadiness on feet (R26.81)    Time: 9147-8295 PT Time Calculation (min) (ACUTE ONLY): 30 min   Charges:   PT Evaluation $PT Eval Low Complexity: 1 Low PT Treatments $Gait Training: 8-22 mins       Encarnacion Chu PT, DPT 11/15/2017, 1:07 PM

## 2017-11-15 NOTE — Care Management Note (Signed)
Case Management Note  Patient Details  Name: Jeremiah James MRN: [id number] Date of Birth: Sep 09, 1933  Subjective/Objective:  Admitted to [facility] with the diagnosis of sepsis. Discharged from this facility to [facility] 10/25/17. States he was released from [employer] about a week ago. Daughter is [name] [phone]). Still sees Dr. Ashley Jacobs at [facility] for his primary care physician. Gets prescriptions fills "some where in [facility] services has been arranged, "they were suppose to start coming this week." Doesn't know name of agency. Ho home oxygen. Standard and rolling walker in the home. Does have Life Alert. Takes care of all basic activities of daily living himself. Daughter helps with errands. Larey Seat prior to this admission. Good appetite.               Action/Plan: Will request physical therapy evaluation   Expected Discharge Date:  11/16/17               Expected Discharge Plan:     In-House Referral:   yes  Discharge planning Services   yes  Post Acute Care Choice:    Choice offered to:     DME Arranged:    DME Agency:     HH Arranged:    HH Agency:     Status of Service:     If discussed at [employer], dates discussed:    Additional Comments:  Gwenette Greet, RN MSN CCM Care Management [phone] 11/15/2017, 8:23 AM

## 2017-11-15 NOTE — Progress Notes (Addendum)
[facility] - [place] at [facility]   PATIENT NAME: Jeremiah James    MR#:  [id number]  DATE OF BIRTH:  Dec 31, 1933  SUBJECTIVE:  CHIEF COMPLAINT:   Chief Complaint  Patient presents with  . Weakness  Patient seen and evaluated today No complaints of any chest pain, shortness of breath Has generalized weakness No new episodes of fever No shortness of breath REVIEW OF SYSTEMS:    ROS  CONSTITUTIONAL: No documented fever. Has fatigue, weakness. No weight gain, no weight loss.  EYES: No blurry or double vision.  ENT: No tinnitus. No postnasal drip. No redness of the oropharynx.  RESPIRATORY: No cough, no wheeze, no hemoptysis. No dyspnea.  CARDIOVASCULAR: No chest pain. No orthopnea. No palpitations. No syncope.  GASTROINTESTINAL: No nausea, no vomiting or diarrhea. No abdominal pain. No melena or hematochezia.  GENITOURINARY: No dysuria or hematuria.  ENDOCRINE: No polyuria or nocturia. No heat or cold intolerance.  HEMATOLOGY: No anemia. No bruising. No bleeding.  INTEGUMENTARY: No rashes. No lesions.  MUSCULOSKELETAL: No arthritis. No swelling. No gout.  NEUROLOGIC: No numbness, tingling, or ataxia. No seizure-type activity.  PSYCHIATRIC: No anxiety. No insomnia. No ADD.   DRUG ALLERGIES:   Allergies  Allergen Reactions  . Morphine Rash    unknown  . Hydrocodone-Acetaminophen Rash    agitation    VITALS:  Blood pressure (!) 126/57, pulse 97, temperature 98.5 F (36.9 C), temperature source Oral, resp. rate 20, height 6' (1.829 m), weight 84.1 kg, SpO2 96 %.  PHYSICAL EXAMINATION:   Physical Exam  GENERAL:  84 y.o.-year-old patient lying in the bed with no acute distress.  EYES: Pupils equal, round, reactive to light and accommodation. No scleral icterus. Extraocular muscles intact.  HEENT: Head atraumatic, normocephalic. Oropharynx and nasopharynx clear.  NECK:  Supple, no jugular venous distention. No thyroid enlargement, no tenderness.  LUNGS:  Normal breath sounds bilaterally, no wheezing, rales, rhonchi. No use of accessory muscles of respiration.  CARDIOVASCULAR: S1, S2 normal. No murmurs, rubs, or gallops.  ABDOMEN: Soft, nontender, nondistended. Bowel sounds present. No organomegaly or mass.  EXTREMITIES: No cyanosis, clubbing or edema b/l.    NEUROLOGIC: Cranial nerves II through XII are intact. No focal Motor or sensory deficits b/l.   PSYCHIATRIC: The patient is alert and oriented x 3.  SKIN: No obvious rash, lesion, or ulcer.   LABORATORY PANEL:   CBC Recent Labs  Lab 11/15/17 0514  WBC 6.6  HGB 10.3*  HCT 32.2*  PLT 161   ------------------------------------------------------------------------------------------------------------------ Chemistries  Recent Labs  Lab 11/14/17 1541 11/15/17 0514  NA 136 139  K 3.6 3.5  CL 100 106  CO2 25 25  GLUCOSE 126* 95  BUN 19 16  CREATININE 1.53* 1.30*  CALCIUM 8.5* 7.8*  AST 22  --   ALT 15  --   ALKPHOS 59  --   BILITOT 0.6  --    ------------------------------------------------------------------------------------------------------------------  Cardiac Enzymes Recent Labs  Lab 11/14/17 1541  TROPONINI <0.03   ------------------------------------------------------------------------------------------------------------------  RADIOLOGY:  Ct Head Wo Contrast  Result Date: 11/14/2017 CLINICAL DATA:  Weakness.  Status post fall this morning. EXAM: CT HEAD WITHOUT CONTRAST TECHNIQUE: Contiguous axial images were obtained from the base of the skull through the vertex without intravenous contrast. COMPARISON:  None. FINDINGS: Brain: No evidence of acute infarction, hemorrhage, hydrocephalus, extra-axial collection or mass lesion/mass effect. Atrophy and extensive chronic microvascular ischemic change are noted. Vascular: Extensive atherosclerosis is seen. Skull: Intact.  No focal lesion. Sinuses/Orbits: The  patient has chronic right maxillary sinus disease. The walls  of the sinus are thickened and secretions within the sinus are slightly hyperattenuating. Small right mastoid effusion noted. Other: None. IMPRESSION: No acute abnormality. Extensive chronic microvascular ischemic change and mild atrophy. Atherosclerosis. Chronic right maxillary sinus disease. Very small right mastoid effusion also noted. Electronically Signed   By: Drusilla Kanner M.D.   On: 11/14/2017 16:38   Dg Chest Port 1 View  Result Date: 11/14/2017 CLINICAL DATA:  Status post fall.  Weakness. EXAM: PORTABLE CHEST 1 VIEW COMPARISON:  10/25/2017 FINDINGS: Cardiomediastinal silhouette is normal. Mediastinal contours appear intact. Tortuosity and calcific atherosclerotic disease of aorta. There is no evidence of focal airspace consolidation, pleural effusion or pneumothorax. Osseous structures are without acute abnormality. Soft tissues are grossly normal. IMPRESSION: No active disease. Tortuosity and calcific atherosclerotic disease of the aorta. Electronically Signed   By: Ted Mcalpine M.D.   On: 11/14/2017 16:22     ASSESSMENT AND PLAN:  84 year old elderly male patient with history of atrial fibrillation on Coumadin, myocardial infarction in the past presented to the emergency room for weakness and fever  -Systemic inflammatory response syndrome improving Continue IV cefepime antibiotic and IV Flagyl antibiotic Discontinue vancomycin Follow-up cultures IV fluids Lactic acid trended down  -Atrial fibrillation Resumed verapamil for rate control Resumed Coumadin for anticoagulation Follow-up INR  -History of myocardial infarction Resume statin medication  -DVT prophylaxis On anticoagulation with Coumadin   All the records are reviewed and case discussed with Care Management/Social Worker. Management plans discussed with the patient, family and they are in agreement.  CODE STATUS: DNR  DVT Prophylaxis: SCDs  TOTAL TIME TAKING CARE OF THIS PATIENT: 32 minutes.    POSSIBLE D/C IN 1 to 2 DAYS, DEPENDING ON CLINICAL CONDITION.  Ihor Austin M.D on 11/15/2017 at 12:57 PM  Between 7am to 6pm - Pager - [phone]  After 6pm go to [email or web address] - password [email or web address]  [facility]  Office  [phone]  CC: Primary care physician; Shawnee Knapp, MD  Note: This dictation was prepared with Dragon dictation along with smaller phrase technology. Any transcriptional errors that result from this process are unintentional.

## 2017-11-15 NOTE — Progress Notes (Signed)
Pt complains of lower back pain and hip pain from his fall. Gave 650mg  of Tylenol x2 and will continue to monitor for changes in condition.

## 2017-11-16 ENCOUNTER — Inpatient Hospital Stay: Payer: Medicare HMO

## 2017-11-16 LAB — PROTIME-INR
INR: 2.14
Prothrombin Time: 23.6 seconds — ABNORMAL HIGH (ref 11.4–15.2)

## 2017-11-16 LAB — URINE CULTURE: CULTURE: NO GROWTH

## 2017-11-16 MED ORDER — AMOXICILLIN-POT CLAVULANATE 875-125 MG PO TABS: 1.0000 | ORAL_TABLET | Freq: Two times a day (BID) | ORAL | 0 refills | Status: DC

## 2017-11-16 MED ORDER — METRONIDAZOLE 500 MG PO TABS
500.0000 mg | ORAL_TABLET | Freq: Three times a day (TID) | ORAL | Status: DC
  Administered 2017-11-16 – 2017-11-17 (×4): 500 mg via ORAL
  Filled 2017-11-16 (×4): qty 1

## 2017-11-16 MED ORDER — CEPHALEXIN 500 MG PO CAPS
500.0000 mg | ORAL_CAPSULE | Freq: Two times a day (BID) | ORAL | Status: DC
  Administered 2017-11-16 – 2017-11-17 (×2): 500 mg via ORAL
  Filled 2017-11-16 (×2): qty 1

## 2017-11-16 MED ORDER — TAMSULOSIN HCL 0.4 MG PO CAPS: 0.4000 mg | ORAL_CAPSULE | Freq: Every day | ORAL | 0 refills | Status: AC

## 2017-11-16 MED ORDER — ENSURE ENLIVE PO LIQD
237.0000 mL | Freq: Two times a day (BID) | ORAL | Status: DC
  Administered 2017-11-16 – 2017-11-17 (×2): 237 mL via ORAL

## 2017-11-16 NOTE — Progress Notes (Signed)
[facility] - [place] at [facility]   PATIENT NAME: Adeyemi Hamad    MR#:  [id number]  DATE OF BIRTH:  06-Aug-1933  SUBJECTIVE:  CHIEF COMPLAINT:   Chief Complaint  Patient presents with  . Weakness  Patient seen and evaluated today No complaints of any chest pain, shortness of breath Has generalized weakness No new episodes of fever No shortness of breath Tolerated IV antibiotics well Patient and family very concerned about the fever and infection that came back Recently they have been treated with antibiotics and discharged but still patient became sick REVIEW OF SYSTEMS:    ROS  CONSTITUTIONAL: No documented fever. Has fatigue, weakness. No weight gain, no weight loss.  EYES: No blurry or double vision.  ENT: No tinnitus. No postnasal drip. No redness of the oropharynx.  RESPIRATORY: No cough, no wheeze, no hemoptysis. No dyspnea.  CARDIOVASCULAR: No chest pain. No orthopnea. No palpitations. No syncope.  GASTROINTESTINAL: No nausea, no vomiting or diarrhea. No abdominal pain. No melena or hematochezia.  GENITOURINARY: No dysuria or hematuria.  ENDOCRINE: No polyuria or nocturia. No heat or cold intolerance.  HEMATOLOGY: No anemia. No bruising. No bleeding.  INTEGUMENTARY: No rashes. No lesions.  MUSCULOSKELETAL: No arthritis. No swelling. No gout.  NEUROLOGIC: No numbness, tingling, or ataxia. No seizure-type activity.  PSYCHIATRIC: No anxiety. No insomnia. No ADD.   DRUG ALLERGIES:   Allergies  Allergen Reactions  . Morphine Rash    unknown  . Hydrocodone-Acetaminophen Rash    agitation    VITALS:  Blood pressure (!) 155/74, pulse 99, temperature 98.4 F (36.9 C), temperature source Oral, resp. rate 18, height 6' (1.829 m), weight 84.1 kg, SpO2 95 %.  PHYSICAL EXAMINATION:   Physical Exam  GENERAL:  84 y.o.-year-old patient lying in the bed with no acute distress.  EYES: Pupils equal, round, reactive to light and accommodation. No scleral  icterus. Extraocular muscles intact.  HEENT: Head atraumatic, normocephalic. Oropharynx and nasopharynx clear.  NECK:  Supple, no jugular venous distention. No thyroid enlargement, no tenderness.  LUNGS: Normal breath sounds bilaterally, no wheezing, rales, rhonchi. No use of accessory muscles of respiration.  CARDIOVASCULAR: S1, S2 normal. No murmurs, rubs, or gallops.  ABDOMEN: Soft, nontender, nondistended. Bowel sounds present. No organomegaly or mass.  EXTREMITIES: No cyanosis, clubbing or edema b/l.    NEUROLOGIC: Cranial nerves II through XII are intact. No focal Motor or sensory deficits b/l.   PSYCHIATRIC: The patient is alert and oriented x 3.  SKIN: No obvious rash, lesion, or ulcer.   LABORATORY PANEL:   CBC Recent Labs  Lab 11/15/17 0514  WBC 6.6  HGB 10.3*  HCT 32.2*  PLT 161   ------------------------------------------------------------------------------------------------------------------ Chemistries  Recent Labs  Lab 11/14/17 1541 11/15/17 0514  NA 136 139  K 3.6 3.5  CL 100 106  CO2 25 25  GLUCOSE 126* 95  BUN 19 16  CREATININE 1.53* 1.30*  CALCIUM 8.5* 7.8*  AST 22  --   ALT 15  --   ALKPHOS 59  --   BILITOT 0.6  --    ------------------------------------------------------------------------------------------------------------------  Cardiac Enzymes Recent Labs  Lab 11/14/17 1541  TROPONINI <0.03   ------------------------------------------------------------------------------------------------------------------  RADIOLOGY:  Ct Head Wo Contrast  Result Date: 11/14/2017 CLINICAL DATA:  Weakness.  Status post fall this morning. EXAM: CT HEAD WITHOUT CONTRAST TECHNIQUE: Contiguous axial images were obtained from the base of the skull through the vertex without intravenous contrast. COMPARISON:  None. FINDINGS: Brain: No evidence of acute  infarction, hemorrhage, hydrocephalus, extra-axial collection or mass lesion/mass effect. Atrophy and extensive  chronic microvascular ischemic change are noted. Vascular: Extensive atherosclerosis is seen. Skull: Intact.  No focal lesion. Sinuses/Orbits: The patient has chronic right maxillary sinus disease. The walls of the sinus are thickened and secretions within the sinus are slightly hyperattenuating. Small right mastoid effusion noted. Other: None. IMPRESSION: No acute abnormality. Extensive chronic microvascular ischemic change and mild atrophy. Atherosclerosis. Chronic right maxillary sinus disease. Very small right mastoid effusion also noted. Electronically Signed   By: Drusilla Kanner M.D.   On: 11/14/2017 16:38   Dg Chest Port 1 View  Result Date: 11/14/2017 CLINICAL DATA:  Status post fall.  Weakness. EXAM: PORTABLE CHEST 1 VIEW COMPARISON:  10/25/2017 FINDINGS: Cardiomediastinal silhouette is normal. Mediastinal contours appear intact. Tortuosity and calcific atherosclerotic disease of aorta. There is no evidence of focal airspace consolidation, pleural effusion or pneumothorax. Osseous structures are without acute abnormality. Soft tissues are grossly normal. IMPRESSION: No active disease. Tortuosity and calcific atherosclerotic disease of the aorta. Electronically Signed   By: Ted Mcalpine M.D.   On: 11/14/2017 16:22     ASSESSMENT AND PLAN:  84 year old elderly male patient with history of atrial fibrillation on Coumadin, myocardial infarction in the past presented to the emergency room for weakness and fever  -Systemic inflammatory response syndrome improved Discontinue IV cefepime antibiotic Discontinue vancomycin Discontinue IV Flagyl antibiotic Switched to oral Keflex and Flagyl antibiotic Follow-up cultures for any growth IV fluids to be discontinued and encourage oral fluid intake Lactic acid trended down Check echocardiogram for any vegetations Renal ultrasound to assess for any renal parenchymal disease, stones or hydronephrosis  -Atrial fibrillation Resumed verapamil  for rate control Resumed Coumadin for anticoagulation Follow-up INR  -History of myocardial infarction Resume statin medication  -DVT prophylaxis On anticoagulation with Coumadin  -Home physical therapy  -I had prolonged discussion with family members and patient Reassured them that the infection is getting cleared  -Start Ensure//boost as a nutritional supplements   All the records are reviewed and case discussed with Care Management/Social Worker. Management plans discussed with the patient, family and they are in agreement.  CODE STATUS: DNR  DVT Prophylaxis: SCDs  TOTAL TIME TAKING CARE OF THIS PATIENT: 31 minutes.   POSSIBLE D/C IN 1 to 2 DAYS, DEPENDING ON CLINICAL CONDITION.  Ihor Austin M.D on 11/16/2017 at 2:57 PM  Between 7am to 6pm - Pager - [phone]  After 6pm go to [email or web address] - password [email or web address]  [facility] [place] [facility]  Office  [phone]  CC: Primary care physician; Shawnee Knapp, MD  Note: This dictation was prepared with Dragon dictation along with smaller phrase technology. Any transcriptional errors that result from this process are unintentional.

## 2017-11-16 NOTE — Progress Notes (Addendum)
Discharge instructions given and went over with patient at bedside. Prescriptions reviewed. All questions answered. Patient  To be discharged home with home health via wheelchair by nursing staff. Bo Mcclintock, RN   Daughter arrived and expressing concern verbally regarding discharge plan. CN and MD made aware.   2:52 PM Discharge canceled. See new orders placed by MD. Bo Mcclintock, RN

## 2017-11-16 NOTE — Care Management (Signed)
Spoke with daughter [name]. States that her father receives Home Health services from [place]. Becky Sax [facility] representative updated. Discharge today per Dr. Tobi Bastos. Daughter will transport Gwenette Greet RN MSN [facility] Care Management [phone]

## 2017-11-16 NOTE — Progress Notes (Signed)
Patients daughter called and notified of pending discharge. Daughter will be available to provide transportation home at noon. Bo Mcclintock, RN

## 2017-11-17 ENCOUNTER — Inpatient Hospital Stay (HOSPITAL_COMMUNITY)
Admit: 2017-11-17 | Discharge: 2017-11-17 | Disposition: A | Discharge status: Home / Self Care | Payer: Medicare HMO | Attending: Internal Medicine | Admitting: Internal Medicine

## 2017-11-17 DIAGNOSIS — I340 Nonrheumatic mitral (valve) insufficiency: Secondary | ICD-10-CM

## 2017-11-17 LAB — PROTIME-INR
INR: 2.02
Prothrombin Time: 22.6 seconds — ABNORMAL HIGH (ref 11.4–15.2)

## 2017-11-17 LAB — ECHOCARDIOGRAM COMPLETE
HEIGHTINCHES: 72 in
WEIGHTICAEL: 2966.51 [oz_av]

## 2017-11-17 MED ORDER — CEPHALEXIN 500 MG PO CAPS: 500.0000 mg | ORAL_CAPSULE | Freq: Two times a day (BID) | ORAL | 0 refills | Status: AC

## 2017-11-17 MED ORDER — SODIUM CHLORIDE 0.9 % IV SOLN: INTRAVENOUS | Status: DC

## 2017-11-17 MED ORDER — ENSURE ENLIVE PO LIQD: 237.0000 mL | Freq: Two times a day (BID) | ORAL | 12 refills | Status: DC

## 2017-11-17 NOTE — Progress Notes (Signed)
Pt daughter called this nurse at 1030 concerned about pt behavior. Daughter stated pt was "talking out of his head". Daughter stated pt is "probably dehydrated and will go into rhabdo really quickly." Pt VSS. A&O x4. MD notified of pt family concerns. No new orders placed at this time.   Pt daughter called this nurse again at 1220. Daughter requesting IVF for pt. MD made aware. Pt does not have IV access. MD ordered to increase oral fluids. Will encourage pt to increase oral fluids. Wil continue to monitor.

## 2017-11-17 NOTE — Discharge Summary (Signed)
[facility] - [place] at [facility]   PATIENT NAME: Jeremiah James    MR#:  [id number]  DATE OF BIRTH:  May 04, 1933  DATE OF ADMISSION:  11/14/2017 ADMITTING PHYSICIAN: Ihor Austin, MD  DATE OF DISCHARGE: 11/17/2017  PRIMARY CARE PHYSICIAN: Shawnee Knapp, MD   ADMISSION DIAGNOSIS:  Altered mental status, unspecified altered mental status type [R41.82] Sepsis, due to unspecified organism, unspecified whether acute organ dysfunction present (HCC) [A41.9] Fever DISCHARGE DIAGNOSIS:  Sepsis probably secondary to viral syndrome Dehydration Chronic atrial fibrillation Elevated lactic acid Metabolic encephalopathy SECONDARY DIAGNOSIS:   Past Medical History:  Diagnosis Date  . A-fib (HCC)   . MI (myocardial infarction) [facility])    x6     ADMITTING HISTORY Jeremiah James  is a 84 y.o. male with a known history of atrial fibrillation on Coumadin for anticoagulation, myocardial infarction in the past presented to the emergency room for fever and weakness.  Patient had a fever of 102.3 F.  Recently has been discharged from rehab to home.  He is alert and awake and responds to all verbal commands no.  Complaints of any cough.  Patient was worked up with chest x-ray which showed no pneumonia.  Urinalysis is pending.  He was tachycardic in the emergency room had elevated lactic acid.  Code sepsis was called patient was started on broad-spectrum IV antibiotics.  He was also worked up with CT head which showed no acute abnormality. Hospitalist service was consulted.  HOSPITAL COURSE:  Vision was worked up with CT head showed no acute abnormality.  Initially patient had a fever when presented to the emergency room.  No new episodes of fever and hospital.  Blood and urine cultures did not reveal any growth.  Patient was started on initially on broad-spectrum IV vancomycin and cefepime antibiotics.  As cultures did not reveal any growth antibiotics were de-escalated.  Patient also  received Flagyl antibiotic.  Patient switched to oral Keflex antibiotic eventually.  Lactic acid has come down.  Dehydration resolved.  Patient continued Coumadin for anti-Coblation for chronic atrial fibrillation.  INR is therapeutic.  Patient hemodynamically stable will be discharged home with home health services and follow-up with primary care physician in the clinic.  Patient was worked up with CT head, renal ultrasound and echocardiogram.  Renal ultrasound did not show any hydronephrosis or stones.  Echocardiogram did not show any vegetations.  CONSULTS OBTAINED:  Treatment Team:  Ihor Austin, MD  DRUG ALLERGIES:   Allergies  Allergen Reactions  . Morphine Rash    unknown  . Hydrocodone-Acetaminophen Rash    agitation    DISCHARGE MEDICATIONS:   Allergies as of 11/17/2017      Reactions   Morphine Rash   unknown   Hydrocodone-acetaminophen Rash   agitation      Medication List    STOP taking these medications   benzonatate 100 MG capsule Commonly known as:  TESSALON     TAKE these medications   acetaminophen 325 MG tablet Commonly known as:  TYLENOL Take 1 tablet by mouth 3 (three) times daily as needed.   allopurinol 100 MG tablet Commonly known as:  ZYLOPRIM Take 1 tablet by mouth daily.   cephALEXin 500 MG capsule Commonly known as:  KEFLEX Take 1 capsule (500 mg total) by mouth every 12 (twelve) hours for 3 days.   feeding supplement (ENSURE ENLIVE) Liqd Take 237 mLs by mouth 2 (two) times daily between meals. Start taking on:  11/18/2017   furosemide 20 MG  tablet Commonly known as:  LASIX Take 1 tablet (20 mg total) by mouth daily.   isosorbide mononitrate 30 MG 24 hr tablet Commonly known as:  IMDUR Take 1 tablet by mouth daily.   nitroGLYCERIN 0.4 MG SL tablet Commonly known as:  NITROSTAT Place 0.4 mg under the tongue every 5 (five) minutes as needed for chest pain.   pantoprazole 40 MG tablet Commonly known as:  PROTONIX Take 1 tablet  by mouth daily.   potassium chloride SA 20 MEQ tablet Commonly known as:  K-DUR,KLOR-CON Take 1 tablet (20 mEq total) by mouth daily.   tamsulosin 0.4 MG Caps capsule Commonly known as:  FLOMAX Take 1 capsule (0.4 mg total) by mouth daily. What changed:  when to take this   verapamil 80 MG tablet Commonly known as:  CALAN Take 80 mg by mouth 2 (two) times daily.   warfarin 2.5 MG tablet Commonly known as:  COUMADIN Take 1 tablet (2.5 mg total) by mouth at bedtime.       Today  Patient seen and evaluated today No shortness of breath No fever Tolerating diet well  VITAL SIGNS:  Blood pressure 114/71, pulse 66, temperature 97.6 F (36.4 C), temperature source Oral, resp. rate 20, height 6' (1.829 m), weight 84.1 kg, SpO2 98 %.  I/O:    Intake/Output Summary (Last 24 hours) at 11/17/2017 1601 Last data filed at 11/17/2017 1010 Gross per 24 hour  Intake 360 ml  Output 550 ml  Net -190 ml    PHYSICAL EXAMINATION:  Physical Exam  GENERAL:  84 y.o.-year-old patient lying in the bed with no acute distress.  LUNGS: Normal breath sounds bilaterally, no wheezing, rales,rhonchi or crepitation. No use of accessory muscles of respiration.  CARDIOVASCULAR: S1, S2 normal. No murmurs, rubs, or gallops.  ABDOMEN: Soft, non-tender, non-distended. Bowel sounds present. No organomegaly or mass.  NEUROLOGIC: Moves all 4 extremities. PSYCHIATRIC: The patient is alert and oriented x 3.  SKIN: No obvious rash, lesion, or ulcer.   DATA REVIEW:   CBC Recent Labs  Lab 11/15/17 0514  WBC 6.6  HGB 10.3*  HCT 32.2*  PLT 161    Chemistries  Recent Labs  Lab 11/14/17 1541 11/15/17 0514  NA 136 139  K 3.6 3.5  CL 100 106  CO2 25 25  GLUCOSE 126* 95  BUN 19 16  CREATININE 1.53* 1.30*  CALCIUM 8.5* 7.8*  AST 22  --   ALT 15  --   ALKPHOS 59  --   BILITOT 0.6  --     Cardiac Enzymes Recent Labs  Lab 11/14/17 1541  TROPONINI <0.03    Microbiology Results  Results  for orders placed or performed during the hospital encounter of 11/14/17  Blood Culture (routine x 2)     Status: None (Preliminary result)   Collection Time: 11/14/17  3:41 PM  Result Value Ref Range Status   Specimen Description BLOOD RIGHT ANTECUBITAL  Final   Special Requests   Final    BOTTLES DRAWN AEROBIC AND ANAEROBIC Blood Culture results may not be optimal due to an excessive volume of blood received in culture bottles   Culture   Final    NO GROWTH 3 DAYS Performed at [facility], [address], [place], [place] [address]    Report Status PENDING  Incomplete  Urine culture     Status: None   Collection Time: 11/14/17  3:41 PM  Result Value Ref Range Status   Specimen Description  Final    URINE, RANDOM Performed at [facility] [address] [place], [place] [address]    Special Requests   Final    NONE Performed at [facility], [address] [place], [place] [address]    Culture   Final    NO GROWTH Performed at [facility], [address], [place], [place] [address]    Report Status 11/16/2017 FINAL  Final  Blood Culture (routine x 2)     Status: None (Preliminary result)   Collection Time: 11/14/17  3:46 PM  Result Value Ref Range Status   Specimen Description BLOOD LEFT HAND  Final   Special Requests   Final    BOTTLES DRAWN AEROBIC AND ANAEROBIC Blood Culture adequate volume   Culture   Final    NO GROWTH 3 DAYS Performed at [facility], [address], [place], [place] [address]    Report Status PENDING  Incomplete    RADIOLOGY:  US Renal  Result Date: 11/16/2017 CLINICAL DATA:  84 year old male with renal disease. Renal stones. Subsequent encounter. EXAM: RENAL / URINARY TRACT ULTRASOUND COMPLETE COMPARISON:  10/19/2017 CT. FINDINGS: Right Kidney: Length: 9.9 cm. Increased echogenicity. No hydronephrosis. Vascular calcification versus nonobstructing stone. 1.5 cm upper pole cyst. Left Kidney:  Length: 10.2 cm. Increased echogenicity. No hydronephrosis. Vascular calcification versus nonobstructing stone. Bladder: Appears normal for degree of bladder distention. IMPRESSION: 1. No hydronephrosis. 2. Vascular calcifications versus nonobstructing renal calculi. 3. Increased renal parenchyma echogenicity may reflect changes of medical renal disease. Electronically Signed   By: Lacy Duverney M.D.   On: 11/16/2017 16:41    Follow up with PCP in 1 week.  Management plans discussed with the patient, family and they are in agreement.  CODE STATUS: DNR    Code Status Orders  (From admission, onward)         Start     Ordered   11/14/17 1840  Do not attempt resuscitation (DNR)  Continuous    Question Answer Comment  In the event of cardiac or respiratory ARREST Do not call a "code blue"   In the event of cardiac or respiratory ARREST Do not perform Intubation, CPR, defibrillation or ACLS   In the event of cardiac or respiratory ARREST Use medication by any route, position, wound care, and other measures to relive pain and suffering. May use oxygen, suction and manual treatment of airway obstruction as needed for comfort.      11/14/17 1839        Code Status History    Date Active Date Inactive Code Status Order ID Comments User Context   10/23/2017 0917 10/25/2017 1904 DNR [id number]  Adrian Saran, MD Inpatient   10/19/2017 2151 10/23/2017 0917 Full Code [id number]  Ramonita Lab, MD Inpatient      TOTAL TIME TAKING CARE OF THIS PATIENT ON DAY OF DISCHARGE: more than 33 minutes.   Ihor Austin M.D on 11/17/2017 at 4:01 PM  Between 7am to 6pm - Pager - [phone]  After 6pm go to [email or web address] - password [email or web address]  [facility] [place] [facility]  Office  [phone]  CC: Primary care physician; Shawnee Knapp, MD  Note: This dictation was prepared with Dragon dictation along with smaller phrase technology. Any transcriptional errors that result from this process are  unintentional.

## 2017-11-17 NOTE — Care Management Note (Signed)
Case Management Note  Patient Details  Name: Neven Fina MRN: [id number] Date of Birth: Nov 25, 1933  Subjective/Objective:   Patient to be discharged per MD order. Orders in place for home health services. Patient had previously been worked up with [facility]. Patient agreeable to this plan. Referral placed with Elnita Maxwell from [facility] for PT and RN. No DME needs. Family to transport. No further RNCM needs. Buddy Duty RN BSN RNCM [phone]                  Action/Plan:   Expected Discharge Date:  11/17/17               Expected Discharge Plan:  Home w Home Health Services  In-House Referral:     Discharge planning Services  CM Consult  Post Acute Care Choice:  Home Health Choice offered to:     DME Arranged:    DME Agency:     HH Arranged:  RN, PT HH Agency:  [facility]  Status of Service:  Completed, signed off  If discussed at Long Length of Stay Meetings, dates discussed:    Additional Comments:  Virgel Manifold, RN 11/17/2017, 4:01 PM

## 2017-11-17 NOTE — Care Management Important Message (Signed)
Important Message  Patient Details  Name: Jeremiah James MRN: [id number] Date of Birth: 11/23/1933   Medicare Important Message Given:  Yes    Olegario Messier A Felton Buczynski 11/17/2017, 12:58 PM

## 2017-11-17 NOTE — Progress Notes (Signed)
*  PRELIMINARY RESULTS* Echocardiogram 2D Echocardiogram has been performed.  Jeremiah James 11/17/2017, 9:30 AM

## 2017-11-19 LAB — CULTURE, BLOOD (ROUTINE X 2)
Culture: NO GROWTH
Culture: NO GROWTH
Special Requests: ADEQUATE

## 2019-05-10 IMAGING — CT CT HEAD W/O CM
3 series · 15 of 47 positions shown, 18 images · non-contrast
Comparison: None.

CLINICAL DATA: Weakness.  Status post fall this morning.

EXAM:
CT HEAD WITHOUT CONTRAST
TECHNIQUE: Contiguous axial images were obtained from the base of the skull
through the vertex without intravenous contrast.

[Series 2: head wo · axial · 0.47mm/px · z∈[-142,-17]mm · 9 of 30 slices shown, 12 images]
[im 3/30  brain]
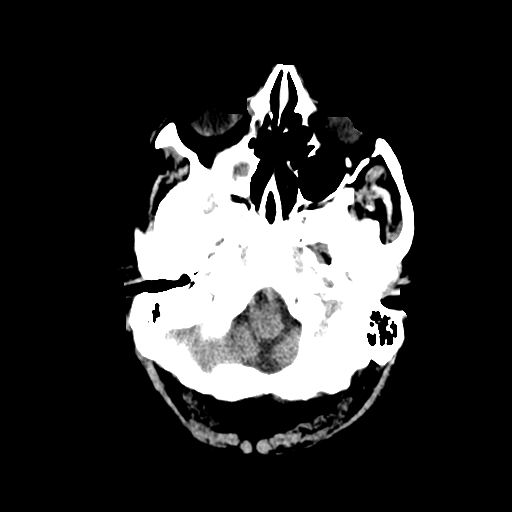
[im 3/30  bone]
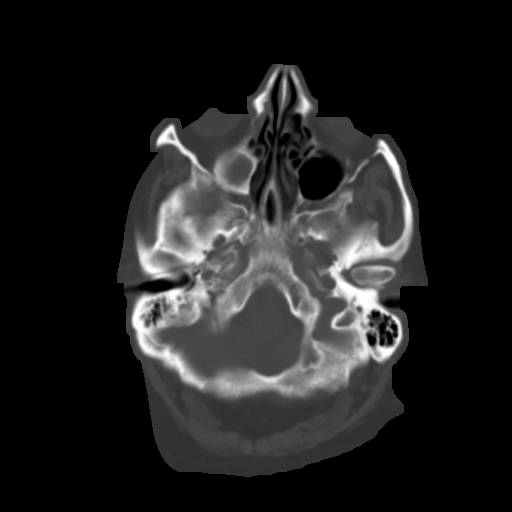
[im 6/30  brain]
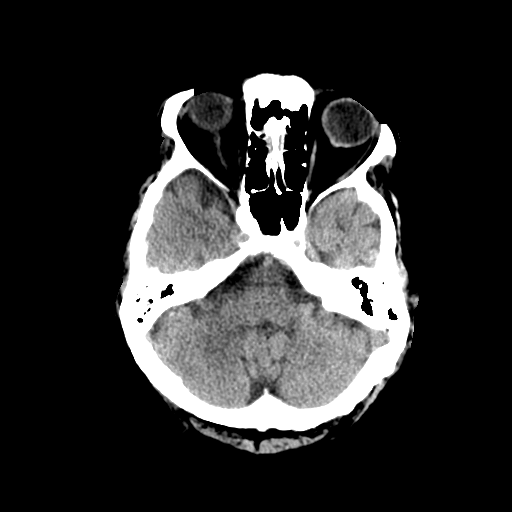
[im 9/30  brain]
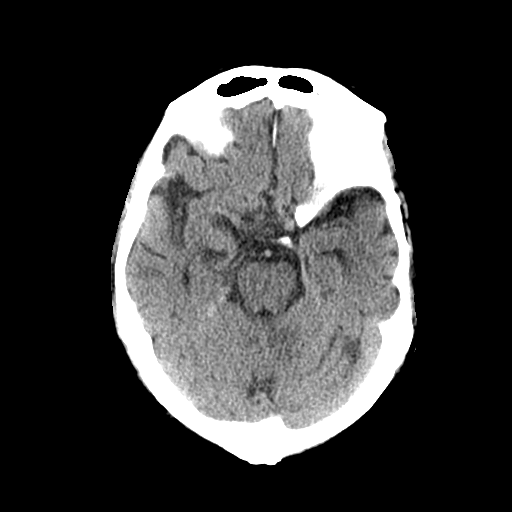
[im 12/30  brain]
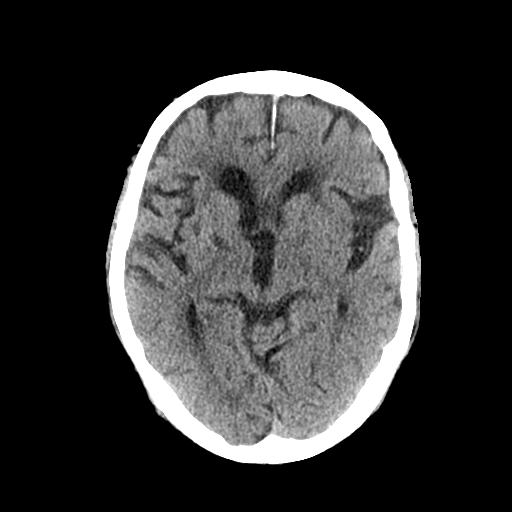
[im 16/30  brain]
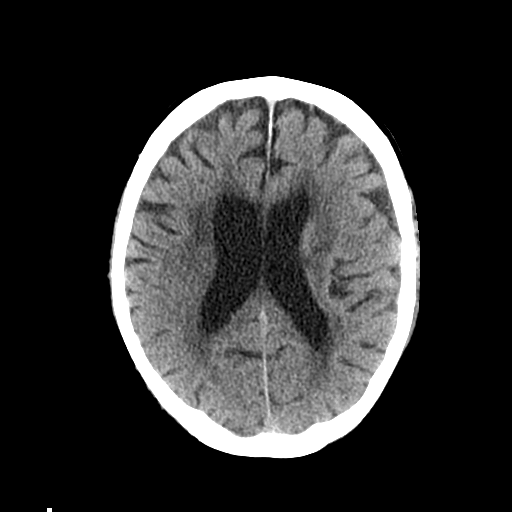
[im 16/30  bone]
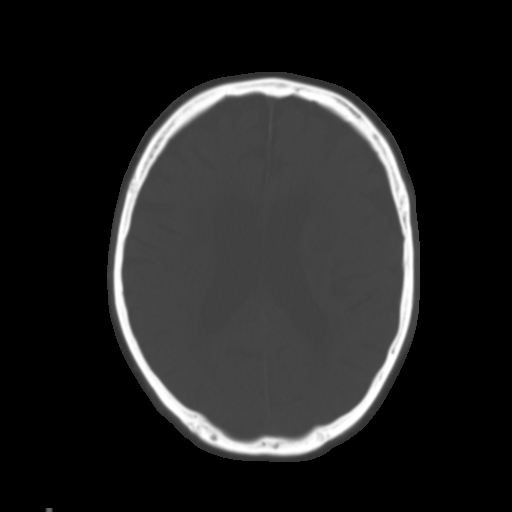
[im 19/30  brain]
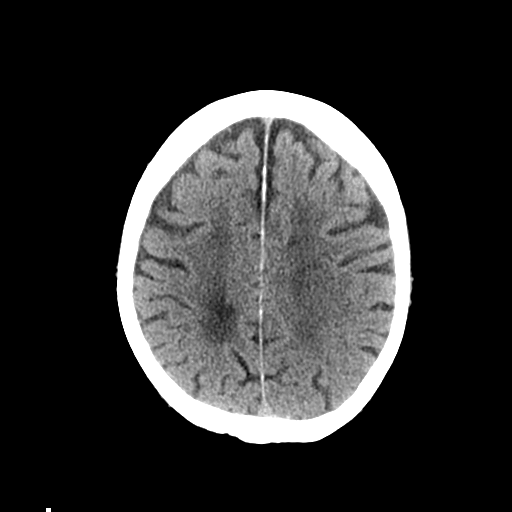
[im 22/30  brain]
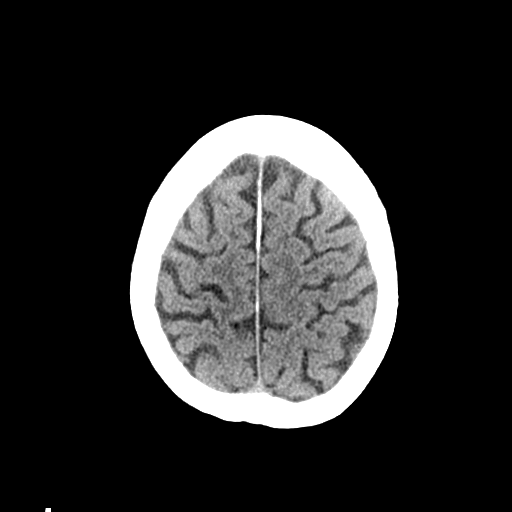
[im 25/30  brain]
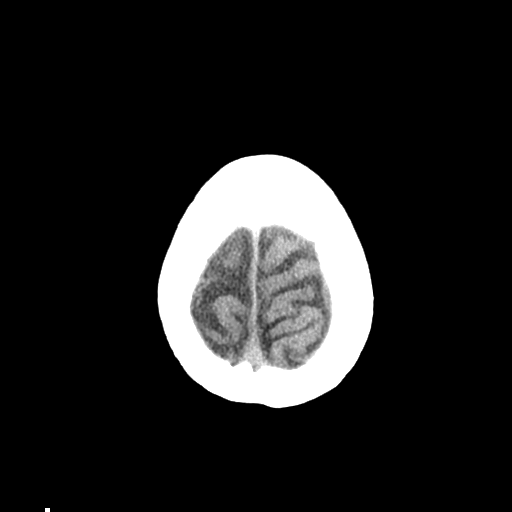
[im 28/30  brain]
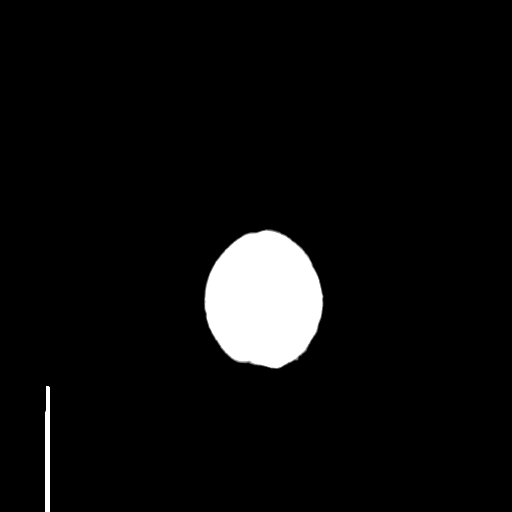
[im 28/30  bone]
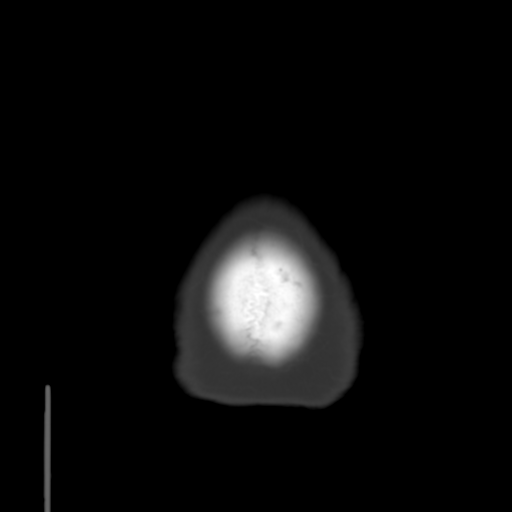

[Series 4: coronal soft tissue · coronal · 0.30mm/px · 3 of 70 slices shown]
[im 24/70  brain]
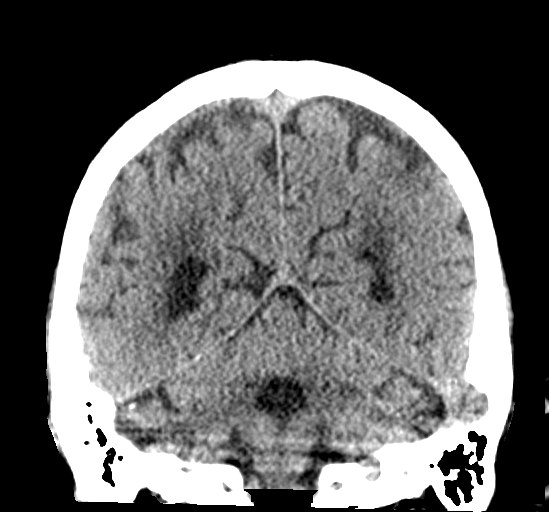
[im 31/70  brain]
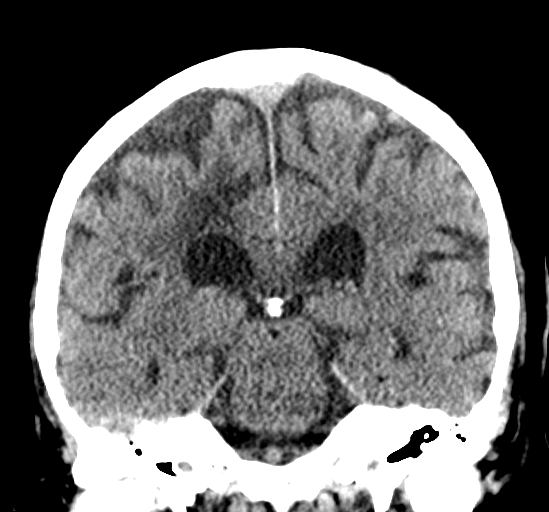
[im 39/70  brain]
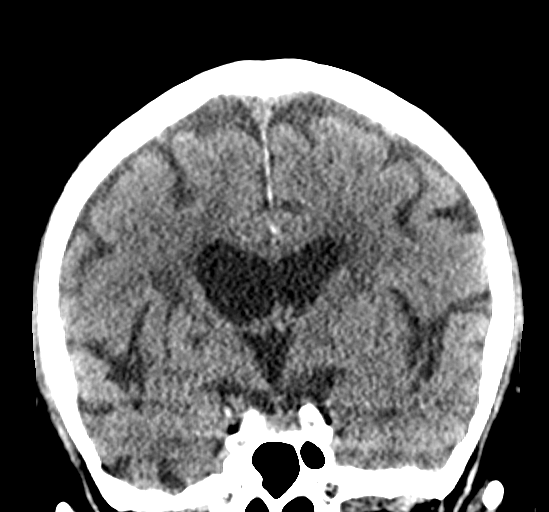

[Series 5: sagittal soft tissue · sagittal · 0.30mm/px · 3 of 58 slices shown]
[im 20/58  brain]
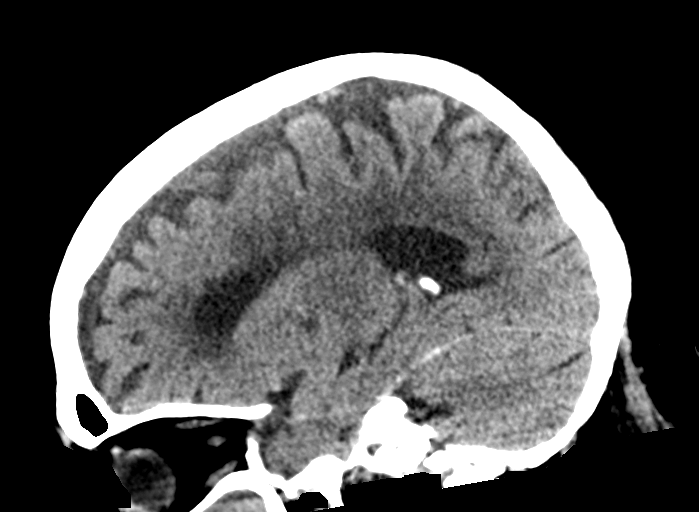
[im 29/58  brain]
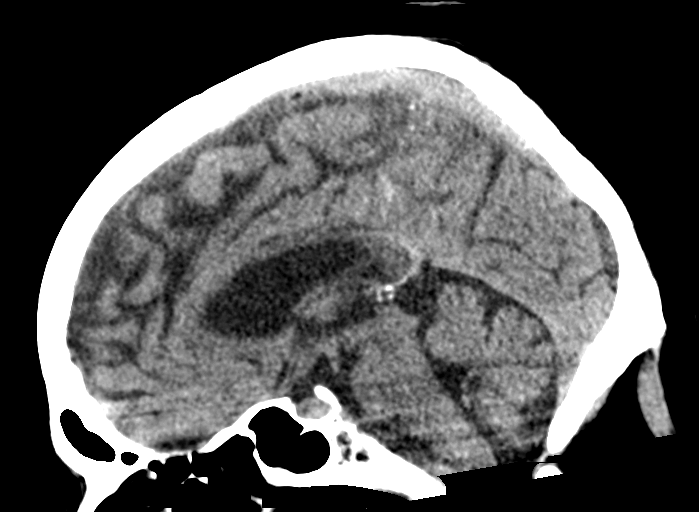
[im 39/58  brain]
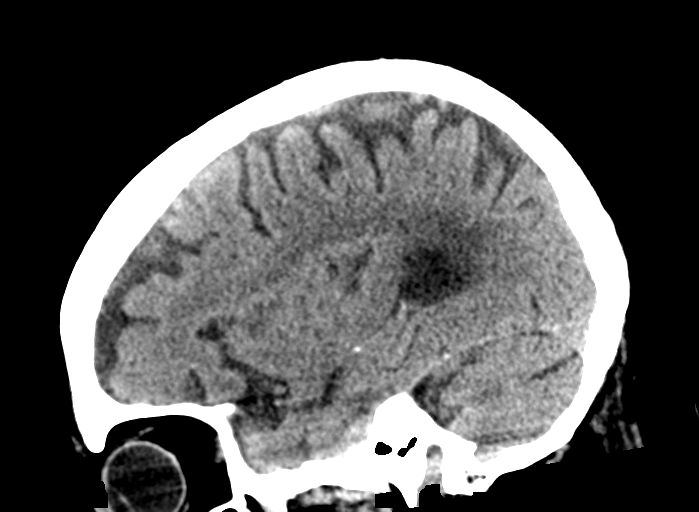

[15 of 47 positions shown; findings below may reference images not displayed]

FINDINGS: Brain: No evidence of acute infarction, hemorrhage, hydrocephalus,
extra-axial collection or mass lesion/mass effect. Atrophy and
extensive chronic microvascular ischemic change are noted.

Vascular: Extensive atherosclerosis is seen.

Skull: Intact.  No focal lesion.

Sinuses/Orbits: The patient has chronic right maxillary sinus
disease. The walls of the sinus are thickened and secretions within
the sinus are slightly hyperattenuating. Small right mastoid
effusion noted.

Other: None.
IMPRESSION: No acute abnormality.

Extensive chronic microvascular ischemic change and mild atrophy.

Atherosclerosis.

Chronic right maxillary sinus disease. Very small right mastoid
effusion also noted.

## 2019-10-17 ENCOUNTER — Emergency Department: Payer: Medicare HMO

## 2019-10-17 ENCOUNTER — Encounter: Payer: Self-pay | Admitting: Emergency Medicine

## 2019-10-17 ENCOUNTER — Inpatient Hospital Stay
Admission: EM | Admit: 2019-10-17 | Discharge: 2019-10-20 | DRG: 871 | Disposition: A | Discharge status: Skilled Nursing Facility | Payer: Medicare HMO | Source: Skilled Nursing Facility | Attending: Internal Medicine | Admitting: Internal Medicine

## 2019-10-17 DIAGNOSIS — N3000 Acute cystitis without hematuria: Secondary | ICD-10-CM | POA: Diagnosis present

## 2019-10-17 DIAGNOSIS — Z1612 Extended spectrum beta lactamase (ESBL) resistance: Secondary | ICD-10-CM | POA: Diagnosis present

## 2019-10-17 DIAGNOSIS — N400 Enlarged prostate without lower urinary tract symptoms: Secondary | ICD-10-CM | POA: Diagnosis present

## 2019-10-17 DIAGNOSIS — D509 Iron deficiency anemia, unspecified: Secondary | ICD-10-CM | POA: Diagnosis present

## 2019-10-17 DIAGNOSIS — I5042 Chronic combined systolic (congestive) and diastolic (congestive) heart failure: Secondary | ICD-10-CM | POA: Diagnosis present

## 2019-10-17 DIAGNOSIS — I252 Old myocardial infarction: Secondary | ICD-10-CM

## 2019-10-17 DIAGNOSIS — Z8249 Family history of ischemic heart disease and other diseases of the circulatory system: Secondary | ICD-10-CM | POA: Diagnosis not present

## 2019-10-17 DIAGNOSIS — N179 Acute kidney failure, unspecified: Secondary | ICD-10-CM | POA: Diagnosis present

## 2019-10-17 DIAGNOSIS — I110 Hypertensive heart disease with heart failure: Secondary | ICD-10-CM | POA: Diagnosis present

## 2019-10-17 DIAGNOSIS — Z885 Allergy status to narcotic agent status: Secondary | ICD-10-CM

## 2019-10-17 DIAGNOSIS — Z66 Do not resuscitate: Secondary | ICD-10-CM | POA: Diagnosis present

## 2019-10-17 DIAGNOSIS — B9620 Unspecified Escherichia coli [E. coli] as the cause of diseases classified elsewhere: Secondary | ICD-10-CM | POA: Diagnosis present

## 2019-10-17 DIAGNOSIS — R1310 Dysphagia, unspecified: Secondary | ICD-10-CM | POA: Diagnosis present

## 2019-10-17 DIAGNOSIS — Z87891 Personal history of nicotine dependence: Secondary | ICD-10-CM

## 2019-10-17 DIAGNOSIS — G9341 Metabolic encephalopathy: Secondary | ICD-10-CM | POA: Diagnosis present

## 2019-10-17 DIAGNOSIS — F0390 Unspecified dementia without behavioral disturbance: Secondary | ICD-10-CM | POA: Diagnosis present

## 2019-10-17 DIAGNOSIS — Z79899 Other long term (current) drug therapy: Secondary | ICD-10-CM | POA: Diagnosis not present

## 2019-10-17 DIAGNOSIS — Z7901 Long term (current) use of anticoagulants: Secondary | ICD-10-CM

## 2019-10-17 DIAGNOSIS — I4821 Permanent atrial fibrillation: Secondary | ICD-10-CM | POA: Diagnosis present

## 2019-10-17 DIAGNOSIS — N390 Urinary tract infection, site not specified: Secondary | ICD-10-CM

## 2019-10-17 DIAGNOSIS — I2510 Atherosclerotic heart disease of native coronary artery without angina pectoris: Secondary | ICD-10-CM | POA: Diagnosis present

## 2019-10-17 DIAGNOSIS — R6520 Severe sepsis without septic shock: Secondary | ICD-10-CM | POA: Diagnosis present

## 2019-10-17 DIAGNOSIS — Z8673 Personal history of transient ischemic attack (TIA), and cerebral infarction without residual deficits: Secondary | ICD-10-CM | POA: Diagnosis not present

## 2019-10-17 DIAGNOSIS — A419 Sepsis, unspecified organism: Secondary | ICD-10-CM | POA: Diagnosis present

## 2019-10-17 DIAGNOSIS — I4891 Unspecified atrial fibrillation: Secondary | ICD-10-CM | POA: Diagnosis present

## 2019-10-17 DIAGNOSIS — G459 Transient cerebral ischemic attack, unspecified: Secondary | ICD-10-CM | POA: Diagnosis present

## 2019-10-17 DIAGNOSIS — Z20822 Contact with and (suspected) exposure to covid-19: Secondary | ICD-10-CM | POA: Diagnosis present

## 2019-10-17 DIAGNOSIS — Z955 Presence of coronary angioplasty implant and graft: Secondary | ICD-10-CM

## 2019-10-17 DIAGNOSIS — M109 Gout, unspecified: Secondary | ICD-10-CM | POA: Diagnosis present

## 2019-10-17 DIAGNOSIS — K869 Disease of pancreas, unspecified: Secondary | ICD-10-CM | POA: Diagnosis present

## 2019-10-17 LAB — CBC WITH DIFFERENTIAL/PLATELET
Abs Immature Granulocytes: 0.1 10*3/uL — ABNORMAL HIGH (ref 0.00–0.07)
Basophils Absolute: 0 10*3/uL (ref 0.0–0.1)
Basophils Relative: 0 %
Eosinophils Absolute: 0 10*3/uL (ref 0.0–0.5)
Eosinophils Relative: 0 %
HCT: 39.3 % (ref 39.0–52.0)
Hemoglobin: 13.5 g/dL (ref 13.0–17.0)
Immature Granulocytes: 1 %
Lymphocytes Relative: 7 %
Lymphs Abs: 1 10*3/uL (ref 0.7–4.0)
MCH: 31.8 pg (ref 26.0–34.0)
MCHC: 34.4 g/dL (ref 30.0–36.0)
MCV: 92.5 fL (ref 80.0–100.0)
Monocytes Absolute: 1.6 10*3/uL — ABNORMAL HIGH (ref 0.1–1.0)
Monocytes Relative: 11 %
Neutro Abs: 12.2 10*3/uL — ABNORMAL HIGH (ref 1.7–7.7)
Neutrophils Relative %: 81 %
Platelets: 249 10*3/uL (ref 150–400)
RBC: 4.25 MIL/uL (ref 4.22–5.81)
RDW: 13.4 % (ref 11.5–15.5)
WBC: 14.7 10*3/uL — ABNORMAL HIGH (ref 4.0–10.5)
nRBC: 0 % (ref 0.0–0.2)

## 2019-10-17 LAB — URINALYSIS, COMPLETE (UACMP) WITH MICROSCOPIC
Bilirubin Urine: NEGATIVE
Glucose, UA: NEGATIVE mg/dL
Ketones, ur: NEGATIVE mg/dL
Nitrite: NEGATIVE
Protein, ur: 100 mg/dL — AB
Specific Gravity, Urine: 1.019 (ref 1.005–1.030)
Squamous Epithelial / LPF: NONE SEEN (ref 0–5)
WBC, UA: >50 WBC/hpf — ABNORMAL HIGH (ref 0–5)
pH: 5 (ref 5.0–8.0)

## 2019-10-17 LAB — COMPREHENSIVE METABOLIC PANEL
ALT: 16 U/L (ref 0–44)
AST: 32 U/L (ref 15–41)
Albumin: 3.6 g/dL (ref 3.5–5.0)
Alkaline Phosphatase: 58 U/L (ref 38–126)
Anion gap: 13 (ref 5–15)
BUN: 31 mg/dL — ABNORMAL HIGH (ref 8–23)
CO2: 26 mmol/L (ref 22–32)
Calcium: 9 mg/dL (ref 8.9–10.3)
Chloride: 101 mmol/L (ref 98–111)
Creatinine, Ser: 1.78 mg/dL — ABNORMAL HIGH (ref 0.61–1.24)
GFR calc Af Amer: 40 mL/min — ABNORMAL LOW (ref >60)
GFR calc non Af Amer: 34 mL/min — ABNORMAL LOW (ref >60)
Glucose, Bld: 127 mg/dL — ABNORMAL HIGH (ref 70–99)
Potassium: 4.1 mmol/L (ref 3.5–5.1)
Sodium: 140 mmol/L (ref 135–145)
Total Bilirubin: 0.8 mg/dL (ref 0.3–1.2)
Total Protein: 8.3 g/dL — ABNORMAL HIGH (ref 6.5–8.1)

## 2019-10-17 LAB — PROTIME-INR
INR: 2.4 — ABNORMAL HIGH (ref 0.8–1.2)
Prothrombin Time: 25.1 seconds — ABNORMAL HIGH (ref 11.4–15.2)

## 2019-10-17 LAB — SARS CORONAVIRUS 2 BY RT PCR (HOSPITAL ORDER, PERFORMED IN ~~LOC~~ HOSPITAL LAB): SARS Coronavirus 2: NEGATIVE

## 2019-10-17 LAB — APTT: aPTT: 49 seconds — ABNORMAL HIGH (ref 24–36)

## 2019-10-17 LAB — LACTIC ACID, PLASMA: Lactic Acid, Venous: 1.9 mmol/L (ref 0.5–1.9)

## 2019-10-17 MED ORDER — WARFARIN - PHARMACIST DOSING INPATIENT
Freq: Every day | Status: DC
  Filled 2019-10-17: qty 1

## 2019-10-17 MED ORDER — SODIUM CHLORIDE 0.9 % IV SOLN
2.0000 g | Freq: Once | INTRAVENOUS | Status: AC
  Administered 2019-10-17: 2 g via INTRAVENOUS
  Filled 2019-10-17: qty 2

## 2019-10-17 MED ORDER — PIPERACILLIN-TAZOBACTAM 3.375 G IVPB
3.3750 g | Freq: Three times a day (TID) | INTRAVENOUS | Status: DC
  Administered 2019-10-17 – 2019-10-18 (×2): 3.375 g via INTRAVENOUS
  Filled 2019-10-17: qty 50

## 2019-10-17 MED ORDER — VANCOMYCIN HCL IN DEXTROSE 1-5 GM/200ML-% IV SOLN
1000.0000 mg | Freq: Once | INTRAVENOUS | Status: AC
  Administered 2019-10-17: 1000 mg via INTRAVENOUS
  Filled 2019-10-17: qty 200

## 2019-10-17 MED ORDER — LACTATED RINGERS IV SOLN: INTRAVENOUS | Status: DC

## 2019-10-17 MED ORDER — METOPROLOL SUCCINATE ER 50 MG PO TB24
50.0000 mg | ORAL_TABLET | Freq: Every day | ORAL | Status: DC
  Administered 2019-10-19 – 2019-10-20 (×2): 50 mg via ORAL
  Filled 2019-10-17 (×3): qty 1

## 2019-10-17 MED ORDER — WARFARIN SODIUM 2.5 MG PO TABS
2.5000 mg | ORAL_TABLET | Freq: Every day | ORAL | Status: DC
  Administered 2019-10-17: 2.5 mg via ORAL
  Filled 2019-10-17: qty 1

## 2019-10-17 MED ORDER — TAMSULOSIN HCL 0.4 MG PO CAPS
0.4000 mg | ORAL_CAPSULE | Freq: Every day | ORAL | Status: DC
  Administered 2019-10-17 – 2019-10-19 (×3): 0.4 mg via ORAL
  Filled 2019-10-17 (×3): qty 1

## 2019-10-17 MED ORDER — ACETAMINOPHEN 650 MG RE SUPP
650.0000 mg | Freq: Four times a day (QID) | RECTAL | Status: DC | PRN
  Administered 2019-10-17: 650 mg via RECTAL
  Filled 2019-10-17: qty 1

## 2019-10-17 MED ORDER — METRONIDAZOLE IN NACL 5-0.79 MG/ML-% IV SOLN
500.0000 mg | Freq: Once | INTRAVENOUS | Status: AC
  Administered 2019-10-17: 500 mg via INTRAVENOUS
  Filled 2019-10-17: qty 100

## 2019-10-17 MED ORDER — ACETAMINOPHEN 650 MG RE SUPP
650.0000 mg | Freq: Once | RECTAL | Status: AC
  Administered 2019-10-17: 650 mg via RECTAL
  Filled 2019-10-17: qty 1

## 2019-10-17 NOTE — ED Triage Notes (Signed)
Pt in from [facility] via [facility] with [facility]MS, difficulty to arouse x 3 days per staff. Also reports poor PO intake x same. Hx dementia, COPD, afib. Pt warm to touch, sats 96% on RA. GCS 11, able to state name

## 2019-10-17 NOTE — ED Notes (Signed)
Resumed care from [place] j rn.  Pt here for ams. Pt does not open eyes to verbal stimuli.  Iv fluids and meds infusing.  afib on monitor.  Pt waiting on admission status.

## 2019-10-17 NOTE — ED Provider Notes (Signed)
[facility] Emergency Department Provider Note    First MD Initiated Contact with Patient 10/17/19 1026     (approximate)  I have reviewed the triage vital signs and the nursing notes.   HISTORY  Chief Complaint Altered Mental Status and Weakness  Level V caveat:  AMS   HPI Jeremiah James is a 86 y.o. male below listed past medical history presents to the ER for altered mental status decreased p.o. intake over the past 3 days as well.  Patient found to be febrile to 100.6 rectal temperature.  Unable to provide much additional history though denies any abdominal pain.  No cough or congestion.  Has been admitted for sepsis in the past.  Not reportedly on any antibiotics right now.    Past Medical History:  Diagnosis Date  . A-fib (HCC)   . MI (myocardial infarction) (HCC)    x6   No family history on file. History reviewed. No pertinent surgical history. Patient Active Problem List   Diagnosis Date Noted  . Sepsis (HCC) 10/19/2017  . Transient ischemic attack 12/03/2012  . S/P coronary artery stent placement 12/03/2012  . Atrial fibrillation (HCC) 06/14/2012  . 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 Other dyspnea and respiratory abnormality 06/14/2012  . Coronary artery disease involving native coronary artery of native heart without angina pectoris 05/05/2012  . Intermediate coronary syndrome (HCC) 05/05/2012  . Family history of ischemic heart disease (IHD) 11/26/2010      Prior to Admission medications   Medication Sig Start Date End Date Taking? Authorizing Provider  acetaminophen (TYLENOL) 500 MG tablet Take 1,000 mg by mouth in the morning and at bedtime.    Yes [provider]  allopurinol (ZYLOPRIM) 100 MG tablet Take 100 mg by mouth daily.    Yes [provider]  divalproex (DEPAKOTE SPRINKLE) 125 MG capsule Take 125 mg by mouth 3 (three) times daily.   Yes [provider]  ferrous  sulfate 325 (65 FE) MG tablet Take 325 mg by mouth daily with breakfast.   Yes [provider]  furosemide (LASIX) 20 MG tablet Take 20 mg by mouth daily.   Yes [provider]  losartan (COZAAR) 25 MG tablet Take 50 mg by mouth daily.   Yes [provider]  metoprolol succinate (TOPROL-XL) 50 MG 24 hr tablet Take 50 mg by mouth daily. 09/24/19  Yes [provider]  nitroGLYCERIN (NITROSTAT) 0.4 MG SL tablet Place 0.4 mg under the tongue every 5 (five) minutes as needed for chest pain.   Yes [provider]  OLANZapine (ZYPREXA) 2.5 MG tablet Take 2.5 mg by mouth at bedtime.   Yes [provider]  pantoprazole (PROTONIX) 40 MG tablet Take 40 mg by mouth daily.    Yes [provider]  tamsulosin (FLOMAX) 0.4 MG CAPS capsule Take 0.4 mg by mouth at bedtime.   Yes [provider]  warfarin (COUMADIN) 2.5 MG tablet Take 1 tablet (2.5 mg total) by mouth at bedtime. 10/25/17  Yes Enid Baas, MD    Allergies Morphine and Hydrocodone-acetaminophen    Social History Social History   Tobacco Use  . Smoking status: Former Smoker    Quit date: 2004    Years since quitting: 17.7  . Smokeless tobacco: Never Used  Vaping Use  . Vaping Use: Never used  Substance Use Topics  . Alcohol use: Never  . Drug use: Never    Review of Systems Patient  denies headaches, rhinorrhea, blurry vision, numbness, shortness of breath, chest pain, edema, cough, abdominal pain, nausea, vomiting, diarrhea, dysuria, fevers, rashes or hallucinations unless otherwise stated above in HPI. ____________________________________________   PHYSICAL EXAM:  VITAL SIGNS: Vitals:   10/17/19 1415 10/17/19 1430  BP:  136/67  Pulse: (!) 109 (!) 109  Resp: (!) 24 (!) 27  Temp:    SpO2: 98% 97%    Constitutional: Alert, disoriented x 3 Eyes: Conjunctivae are normal.  Head: Atraumatic. Nose: No congestion/rhinnorhea. Mouth/Throat: Mucous  membranes are moist.   Neck: No stridor. Painless ROM.  Cardiovascular: Normal rate, regular rhythm. Grossly normal heart sounds.  Good peripheral circulation. Respiratory: Normal respiratory effort.  No retractions. Lungs CTAB. Gastrointestinal: Soft and nontender in all four quadrants. No distention. No abdominal bruits. No CVA tenderness. Genitourinary: normal external genitalia Musculoskeletal: No lower extremity tenderness nor edema.  No joint effusions. Neurologic:   No gross focal neurologic deficits are appreciated. No facial droop Skin:  Skin is warm, dry and intact. No rash noted. Psychiatric: unable to assess  ____________________________________________   LABS (all labs ordered are listed, but only abnormal results are displayed)  Results for orders placed or performed during the hospital encounter of 10/17/19 (from the past 24 hour(s))  Lactic acid, plasma     Status: None   Collection Time: 10/17/19 10:57 AM  Result Value Ref Range   Lactic Acid, Venous 1.9 0.5 - 1.9 mmol/L  Comprehensive metabolic panel     Status: Abnormal   Collection Time: 10/17/19 10:57 AM  Result Value Ref Range   Sodium 140 135 - 145 mmol/L   Potassium 4.1 3.5 - 5.1 mmol/L   Chloride 101 98 - 111 mmol/L   CO2 26 22 - 32 mmol/L   Glucose, Bld 127 (H) 70 - 99 mg/dL   BUN 31 (H) 8 - 23 mg/dL   Creatinine, Ser 1.321.78 (H) 0.61 - 1.24 mg/dL   Calcium 9.0 8.9 - 44.010.3 mg/dL   Total Protein 8.3 (H) 6.5 - 8.1 g/dL   Albumin 3.6 3.5 - 5.0 g/dL   AST 32 15 - 41 U/L   ALT 16 0 - 44 U/L   Alkaline Phosphatase 58 38 - 126 U/L   Total Bilirubin 0.8 0.3 - 1.2 mg/dL   GFR calc non Af Amer 34 (L) >60 mL/min   GFR calc Af Amer 40 (L) >60 mL/min   Anion gap 13 5 - 15  CBC WITH DIFFERENTIAL     Status: Abnormal   Collection Time: 10/17/19 10:57 AM  Result Value Ref Range   WBC 14.7 (H) 4.0 - 10.5 K/uL   RBC 4.25 4.22 - 5.81 MIL/uL   Hemoglobin 13.5 13.0 - 17.0 g/dL   HCT 10.239.3 39 - 52 %   MCV 92.5 80.0 -  100.0 fL   MCH 31.8 26.0 - 34.0 pg   MCHC 34.4 30.0 - 36.0 g/dL   RDW 72.513.4 36.611.5 - 44.015.5 %   Platelets 249 150 - 400 K/uL   nRBC 0.0 0.0 - 0.2 %   Neutrophils Relative % 81 %   Neutro Abs 12.2 (H) 1.7 - 7.7 K/uL   Lymphocytes Relative 7 %   Lymphs Abs 1.0 0.7 - 4.0 K/uL   Monocytes Relative 11 %   Monocytes Absolute 1.6 (H) 0 - 1 K/uL   Eosinophils Relative 0 %   Eosinophils Absolute 0.0 0 - 0 K/uL   Basophils Relative 0 %   Basophils Absolute 0.0 0 - 0  K/uL   Immature Granulocytes 1 %   Abs Immature Granulocytes 0.10 (H) 0.00 - 0.07 K/uL  Protime-INR     Status: Abnormal   Collection Time: 10/17/19 10:57 AM  Result Value Ref Range   Prothrombin Time 25.1 (H) 11.4 - 15.2 seconds   INR 2.4 (H) 0.8 - 1.2  APTT     Status: Abnormal   Collection Time: 10/17/19 10:57 AM  Result Value Ref Range   aPTT 49 (H) 24 - 36 seconds  Urinalysis, Complete w Microscopic Urine, Catheterized     Status: Abnormal   Collection Time: 10/17/19 11:10 AM  Result Value Ref Range   Color, Urine AMBER (A) YELLOW   APPearance CLOUDY (A) CLEAR   Specific Gravity, Urine 1.019 1.005 - 1.030   pH 5.0 5.0 - 8.0   Glucose, UA NEGATIVE NEGATIVE mg/dL   Hgb urine dipstick LARGE (A) NEGATIVE   Bilirubin Urine NEGATIVE NEGATIVE   Ketones, ur NEGATIVE NEGATIVE mg/dL   Protein, ur 428 (A) NEGATIVE mg/dL   Nitrite NEGATIVE NEGATIVE   Leukocytes,Ua LARGE (A) NEGATIVE   RBC / HPF 21-50 0 - 5 RBC/hpf   WBC, UA >50 (H) 0 - 5 WBC/hpf   Bacteria, UA FEW (A) NONE SEEN   Squamous Epithelial / LPF NONE SEEN 0 - 5   WBC Clumps PRESENT    Mucus PRESENT    ____________________________________________  EKG My review and personal interpretation at Time: 10:38   Indication: weakness  Rate: 110  Rhythm: afib Axis: normal Other: normal intervals, no stemi ____________________________________________  RADIOLOGY  I personally reviewed all radiographic images ordered to evaluate for the above acute complaints and reviewed  radiology reports and findings.  These findings were personally discussed with the patient.  Please see medical record for radiology report.  ____________________________________________   PROCEDURES  Procedure(s) performed:  .Critical Care Performed by: Willy Eddy, MD Authorized by: Willy Eddy, MD   Critical care provider statement:    Critical care time (minutes):  40   Critical care time was exclusive of:  Separately billable procedures and treating other patients   Critical care was necessary to treat or prevent imminent or life-threatening deterioration of the following conditions:  Sepsis   Critical care was time spent personally by me on the following activities:  Development of treatment plan with patient or surrogate, discussions with consultants, evaluation of patient's response to treatment, examination of patient, obtaining history from patient or surrogate, ordering and performing treatments and interventions, ordering and review of laboratory studies, ordering and review of radiographic studies, pulse oximetry, re-evaluation of patient's condition and review of old charts      Critical Care performed: yes ____________________________________________   INITIAL IMPRESSION / ASSESSMENT AND PLAN / ED COURSE  Pertinent labs & imaging results that were available during my care of the patient were reviewed by me and considered in my medical decision making (see chart for details).   DDX: Dehydration, sepsis, pna, uti, hypoglycemia, cva, drug effect, withdrawal, encephalitis   Jeremiah James is a 86 y.o. who presents to the ED with presentation as described above.  Patient does appear altered, suspect possible uti, pna, ich, sepsis.  Blood work we sent for by differential.  Will provide IV hydration.  The patient will be placed on continuous pulse oximetry and telemetry for monitoring.  Laboratory evaluation will be sent to evaluate for the above complaints.      Clinical Course as of Oct 16 1529  Tue Oct 17, 2019  1201 Lactate is fortunately normal. Still awaiting remainder blood work-up. So as not to delay antibiotics while awaiting results we will go ahead and order antibiotics   [PR]  1455 Urine is consistent with acute cystitis.  Given his sepsis will order CT imaging to exclude stone or obstructive uropathy.   [PR]    Clinical Course User Index [PR] Willy Eddy, MD    The patient was evaluated in Emergency Department today for the symptoms described in the history of present illness. He/she was evaluated in the context of the global COVID-19 pandemic, which necessitated consideration that the patient might be at risk for infection with the SARS-CoV-2 virus that causes COVID-19. Institutional protocols and algorithms that pertain to the evaluation of patients at risk for COVID-19 are in a state of rapid change based on information released by regulatory bodies including the CDC and federal and state organizations. These policies and algorithms were followed during the patient's care in the ED.  As part of my medical decision making, I reviewed the following data within the electronic MEDICAL RECORD NUMBER Nursing notes reviewed and incorporated, Labs reviewed, notes from prior ED visits and [place] Controlled Substance Database   ____________________________________________   FINAL CLINICAL IMPRESSION(S) / ED DIAGNOSES  Final diagnoses:  Sepsis with encephalopathy without septic shock, due to unspecified organism [facility])      NEW MEDICATIONS STARTED DURING THIS VISIT:  New Prescriptions   No medications on file     Note:  This document was prepared using Dragon voice recognition software and may include unintentional dictation errors.    Willy Eddy, MD 10/17/19 1531

## 2019-10-17 NOTE — ED Notes (Signed)
 bladder scan   Family with pt.  Pt resting with eyes closed

## 2019-10-17 NOTE — Progress Notes (Signed)
PHARMACY -  BRIEF ANTIBIOTIC NOTE   Pharmacy has received consult(s) for Cefepime and Vancomycin from an ED provider.  The patient's profile has been reviewed for ht/wt/allergies/indication/available labs.    One time order(s) placed by MD for Vancomycin 1 gram and Cefepime 2 gm  Further antibiotics/pharmacy consults should be ordered by admitting physician if indicated.                       Thank you, Kalab Camps A 10/17/2019  12:23 PM

## 2019-10-17 NOTE — ED Notes (Signed)
Report off to andrea rn cpod nurse

## 2019-10-17 NOTE — ED Notes (Signed)
Pt awake sinus tach on monitor at 105.  Iv meds infusing.

## 2019-10-17 NOTE — Progress Notes (Signed)
ANTICOAGULATION CONSULT NOTE - Initial Consult  Pharmacy Consult for Warfarin Indication: atrial fibrillation  Allergies  Allergen Reactions  . Morphine Rash    unknown  . Hydrocodone-Acetaminophen Rash    agitation    Patient Measurements: Height: 5\' 8"  (172.7 cm) Weight: 84.1 kg (185 lb 6.5 oz) IBW/kg (Calculated) : 68.4 Heparin Dosing Weight:    Vital Signs: Temp: 100.6 F (38.1 C) (09/21 1045) Temp Source: Rectal (09/21 1045) BP: 136/67 (09/21 1430) Pulse Rate: 115 (09/21 1545)  Labs: Recent Labs    10/17/19 1057  HGB 13.5  HCT 39.3  PLT 249  APTT 49*  LABPROT 25.1*  INR 2.4*  CREATININE 1.78*    Estimated Creatinine Clearance: 32.6 mL/min (A) (by C-G formula based on SCr of 1.78 mg/dL (H)).   Medical History: Past Medical History:  Diagnosis Date  . A-fib (HCC)   . MI (myocardial infarction) (HCC)    x6    Medications:  Scheduled:  . warfarin  2.5 mg Oral q1600  . Warfarin - Pharmacist Dosing Inpatient   Does not apply q1600   Infusions:  . lactated ringers Stopped (10/17/19 1232)  . lactated ringers Stopped (10/17/19 1310)  . piperacillin-tazobactam (ZOSYN)  IV    . vancomycin 1,000 mg (10/17/19 1548)    Assessment: 86 yo M on Warfarin for Afib PTA. Home dose: Warfarin 2.5 mg po at bedtime Hgb 13.5 Plt 249  9/21 INR 2.4    Goal of Therapy:  INR 2-3 Monitor platelets by anticoagulation protocol: Yes   Plan:  Will continue patient's home dose of Warfarin 2.5 mg PO Daily.  DDI: abx (Zosyn) Will f/u INR in am  Batoul Limes A 10/17/2019,4:43 PM

## 2019-10-17 NOTE — ED Notes (Signed)
Culture set one drawn from [facility] and sent to the lab.

## 2019-10-17 NOTE — H&P (Addendum)
History and Physical    Jeremiah James OJJ:[id number] DOB: 28-Sep-1933 DOA: 10/17/2019  PCP: Lauro Regulus, MD  Patient coming from: SNF [facility])   Chief Complaint: altered mental status  HPI: Jeremiah James is a 86 y.o. 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  Patient not speaking, unable to obtain history from him.  Daughter says patient has had several months of slow decline but at baseline sits up in wheelchair, is conversant but frequently confused. Per report several days of altered mental status, eating and drinking less, not getting out of bed. No report of vomiting or diarrhea, no report of chest pain, no report of cough. Patient is covid vaccinated.  ED Course: labs obtained, significant for cr of 1.78, elevated wbcs with left shift. CXR neg, CT head negative, CT a/p showing signs of cystitis. Lactate wnl. LR @ 150 ordered and vanc/cefepime/flagyl given. Temperature found to be 100.6 rectal, hr 109 in a fib, bp wnl. Admitted for "urosepsis." covid swab pending.  Review of Systems: As per HPI otherwise 10 point review of systems negative.    Past Medical History:  Diagnosis Date  . A-fib (HCC)   . MI (myocardial infarction) (HCC)    x6    History reviewed. No pertinent surgical history.   reports that he quit smoking about 17 years ago. He has never used smokeless tobacco. He reports that he does not drink alcohol and does not use drugs.  Allergies  Allergen Reactions  . Morphine Rash    unknown  . Hydrocodone-Acetaminophen Rash    agitation    No family history on file.  Prior to Admission medications   Medication Sig Start Date End Date Taking? Authorizing Provider  acetaminophen (TYLENOL) 500 MG tablet Take 1,000 mg by mouth in the morning and at bedtime.    Yes [provider]  allopurinol (ZYLOPRIM)  100 MG tablet Take 100 mg by mouth daily.    Yes [provider]  divalproex (DEPAKOTE SPRINKLE) 125 MG capsule Take 125 mg by mouth 3 (three) times daily.   Yes [provider]  ferrous sulfate 325 (65 FE) MG tablet Take 325 mg by mouth daily with breakfast.   Yes [provider]  furosemide (LASIX) 20 MG tablet Take 20 mg by mouth daily.   Yes [provider]  losartan (COZAAR) 25 MG tablet Take 50 mg by mouth daily.   Yes [provider]  metoprolol succinate (TOPROL-XL) 50 MG 24 hr tablet Take 50 mg by mouth daily. 09/24/19  Yes [provider]  nitroGLYCERIN (NITROSTAT) 0.4 MG SL tablet Place 0.4 mg under the tongue every 5 (five) minutes as needed for chest pain.   Yes [provider]  OLANZapine (ZYPREXA) 2.5 MG tablet Take 2.5 mg by mouth at bedtime.   Yes [provider]  pantoprazole (PROTONIX) 40 MG tablet Take 40 mg by mouth daily.    Yes [provider]  tamsulosin (FLOMAX) 0.4 MG CAPS capsule Take 0.4 mg by mouth at bedtime.   Yes [provider]  warfarin (COUMADIN) 2.5 MG tablet Take 1 tablet (2.5 mg total) by mouth at bedtime. 10/25/17  Yes Enid Baas, MD    Physical Exam: Vitals:   10/17/19 1445 10/17/19 1515 10/17/19 1530 10/17/19 1545  BP:      Pulse: (!) 108 (!) 114 Marland Kitchen)  112 (!) 115  Resp: (!) 26 (!) 26 (!) 28 (!) 26  Temp:      TempSrc:      SpO2: 97% 97% 97% 97%  Weight:        Constitutional: chronically ill appearing, not conversant Head: Atraumatic Eyes: Conjunctiva clear ENM: dry mucous membranes.  Neck: Supple Respiratory: diminished effort, clear Cardiovascular: tachycardic, irreg irreg Abdomen: soft, non-tender. Musculoskeletal: decreased tone Skin: No rashes, lesions, or ulcers.  Extremities: No peripheral edema. Palpable peripheral pulses. Neurologic: awake, moving all 4 extremities Psychiatric: non-verbal   Labs on Admission: I have personally  reviewed following labs and imaging studies  CBC: Recent Labs  Lab 10/17/19 1057  WBC 14.7*  NEUTROABS 12.2*  HGB 13.5  HCT 39.3  MCV 92.5  PLT 249   Basic Metabolic Panel: Recent Labs  Lab 10/17/19 1057  NA 140  K 4.1  CL 101  CO2 26  GLUCOSE 127*  BUN 31*  CREATININE 1.78*  CALCIUM 9.0   GFR: CrCl cannot be calculated (Unknown ideal weight.). Liver Function Tests: Recent Labs  Lab 10/17/19 1057  AST 32  ALT 16  ALKPHOS 58  BILITOT 0.8  PROT 8.3*  ALBUMIN 3.6   No results for input(s): LIPASE, AMYLASE in the last 168 hours. No results for input(s): AMMONIA in the last 168 hours. Coagulation Profile: Recent Labs  Lab 10/17/19 1057  INR 2.4*   Cardiac Enzymes: No results for input(s): CKTOTAL, CKMB, CKMBINDEX, TROPONINI in the last 168 hours. BNP (last 3 results) No results for input(s): PROBNP in the last 8760 hours. HbA1C: No results for input(s): HGBA1C in the last 72 hours. CBG: No results for input(s): GLUCAP in the last 168 hours. Lipid Profile: No results for input(s): CHOL, HDL, LDLCALC, TRIG, CHOLHDL, LDLDIRECT in the last 72 hours. Thyroid Function Tests: No results for input(s): TSH, T4TOTAL, FREET4, T3FREE, THYROIDAB in the last 72 hours. Anemia Panel: No results for input(s): VITAMINB12, FOLATE, FERRITIN, TIBC, IRON, RETICCTPCT in the last 72 hours. Urine analysis:    Component Value Date/Time   COLORURINE AMBER (A) 10/17/2019 1110   APPEARANCEUR CLOUDY (A) 10/17/2019 1110   LABSPEC 1.019 10/17/2019 1110   PHURINE 5.0 10/17/2019 1110   GLUCOSEU NEGATIVE 10/17/2019 1110   HGBUR LARGE (A) 10/17/2019 1110   BILIRUBINUR NEGATIVE 10/17/2019 1110   KETONESUR NEGATIVE 10/17/2019 1110   PROTEINUR 100 (A) 10/17/2019 1110   NITRITE NEGATIVE 10/17/2019 1110   LEUKOCYTESUR LARGE (A) 10/17/2019 1110    Radiological Exams on Admission: CT ABDOMEN PELVIS WO CONTRAST  Result Date: 10/17/2019 CLINICAL DATA:  Altered mental status.  Poor oral  intake EXAM: CT ABDOMEN AND PELVIS WITHOUT CONTRAST TECHNIQUE: Multidetector CT imaging of the abdomen and pelvis was performed following the standard protocol without IV contrast. COMPARISON:  10/19/2017 FINDINGS: Lower chest: Bibasilar scarring or atelectasis. Cardiomegaly. Trace pericardial effusion. Coronary artery calcification. Hepatobiliary: Multiple probable cysts the liver appear unchanged the prior study. No new focal hepatic lesion is identified on noncontrast study. Status post cholecystectomy. No biliary dilatation. Pancreas: Along the anterior margin of the pancreatic head/neck is a ovoid mass measuring 2.3 x 2.0 x 2.1 cm measuring fluid density (series 3, image 23; series 7, image 59). No pancreatic ductal dilatation. No peripancreatic inflammatory changes. Spleen: Normal in size without focal abnormality. Adrenals/Urinary Tract: Adrenal glands within normal limits. 1.5 cm right renal cyst. Vascular calcifications within the bilateral renal hila. No definite renal stone. No hydronephrosis. Bilateral ureters are unremarkable. Mild circumferential thickening of the  urinary bladder wall, which may be accentuated by underdistention. Stomach/Bowel: Stomach is within normal limits. Appendix appears normal. Sigmoid diverticulosis. No evidence of bowel wall thickening, distention, or inflammatory changes. Mildly prominent rectal stool ball. Vascular/Lymphatic: Aortic atherosclerosis. No enlarged abdominal or pelvic lymph nodes. Reproductive: Prostate gland appears within normal limits. Other: No free fluid. No abdominopelvic fluid collection. No pneumoperitoneum. No abdominal wall hernia. Musculoskeletal: L1 superior endplate Schmorl's node with associated age indeterminate mild compression fracture (series 7, image 37), new from prior. No bony retropulsion. Advanced degenerative disc disease and facet arthropathy throughout the lumbar spine. IMPRESSION: 1. Mild circumferential thickening of the urinary  bladder wall, which may be accentuated by underdistention. Correlate with urinalysis to exclude cystitis. 2. Sigmoid diverticulosis without evidence of acute diverticulitis. 3. L1 superior endplate Schmorl's node with associated age-indeterminate mild compression fracture, new from prior. No bony retropulsion. Correlate with point tenderness. 4. Along the anterior margin of the pancreatic head/neck is a 2.3 cm ovoid mass measuring fluid density. Findings may represent a cystic pancreatic neoplasm or possibly a pseudocyst. A nonemergent MRI/MRCP or contrast enhanced CT of the abdomen is suggested for baseline to guide/determine future follow-up and management. 5. Cardiomegaly with trace pericardial effusion. 6. Aortic atherosclerosis. (ICD10-I70.0). Electronically Signed   By: Duanne Guess D.O.   On: 10/17/2019 15:37   CT Head Wo Contrast  Result Date: 10/17/2019 CLINICAL DATA:  Mental status change. EXAM: CT HEAD WITHOUT CONTRAST TECHNIQUE: Contiguous axial images were obtained from the base of the skull through the vertex without intravenous contrast. COMPARISON:  11/14/2017 FINDINGS: Brain: No evidence of acute infarction, hemorrhage, hydrocephalus, extra-axial collection or mass lesion/mass effect. Advanced atrophy and chronic microvascular ischemic changes are again noted. These appear to have progressed since 2019. Bilateral basal ganglia lacunar infarcts are again noted. Vascular: No hyperdense vessel or unexpected calcification. Skull: Normal. Negative for fracture or focal lesion. Sinuses/Orbits: No acute finding. Other: None. IMPRESSION: 1. No acute intracranial abnormality. 2. Advanced atrophy and chronic microvascular ischemic changes are again noted. These appear to have progressed since 2019. Electronically Signed   By: Katherine Mantle M.D.   On: 10/17/2019 15:20   DG Chest Port 1 View  Result Date: 10/17/2019 CLINICAL DATA:  Altered mental status EXAM: PORTABLE CHEST 1 VIEW COMPARISON:   November 14, 2017 FINDINGS: The cardiomediastinal silhouette is unchanged and enlarged in contour.Atherosclerotic calcifications of the aorta. No pleural effusion. No pneumothorax. No acute pleuroparenchymal abnormality. Unchanged bibasilar atelectasis. Visualized abdomen is unremarkable. Multilevel degenerative changes of the thoracic spine. IMPRESSION: No acute cardiopulmonary abnormality. Electronically Signed   By: Meda Klinefelter MD   On: 10/17/2019 11:08    EKG: Independently reviewed. A-fib @ 110  Assessment/Plan Principal Problem:   Complicated UTI (urinary tract infection) Active Problems:   Atrial fibrillation (HCC)   Coronary artery disease involving native coronary artery of native heart without angina pectoris   Transient ischemic attack   Sepsis (HCC)   # Acute complicated UTI # Sepsis - patient unable to share whether symptoms of UTI but urinalysis suggestive and so is CT. Febrile, tachycardic, with leukocytosis. Normal lactate, hx combined chf, received non-aggressive fluid resuscitation in ED w/ LR @ 150. Received broad spectrum abx vanc/cefepime/flagyl in ED. - f/u covid results, contact/airborne until then - start zosyn, d/c other abx - f/u urine and blood cultures - cont LR @ 150, likely decrease rate in AM - rectal tylenol prn  # AKI - cr 1.78 from 1.1-1.3 last year. Likely prerenal 2/2 sepsis -  fluid hydration as above - AM BMP  # Atrial fibrillation - in a fib, hr 110 - cont home metoprolol - tele for 24 hours - warfarin per pharmacy consult, given advanced dementia consider holding at d/c. INR therapeutic  # Combined systolic/diastolic CHF # CAD - no signs exacerbation. EF 34-40 at [facility] in 2020 per cardiology note, with grade 3 diastolic dysfunction. Has a DES. - hold home lasix and losartan given aki, cont home metoprolol - is not on aspirin at home  # Htn  - hold losartan while septic and with AKI  # BPH - cont home flomax - strict I/Os - ED  bladder scan 125 ml, watch for signs obstruction as patient hydrates  # Pancreatic mass - incidental on CT - outpatient f/u, given advanced dementia likely reasonable to defer  # Dementia - hold home olanzapine and depakote  DVT prophylaxis: therapeutic warfarin Code Status: DNR  Family Communication: updated daughter telphonically  Disposition Plan: likely back to SNF  Consults called: none  Admission status: progressive tele    Silvano Bilis MD [facility] Pager [phone]  If 7PM-7AM, please contact night-coverage [email or web address] Password [facility]  10/17/2019, 4:25 PM

## 2019-10-17 NOTE — ED Notes (Signed)
Pt more alert.  Eyes open.

## 2019-10-17 NOTE — ED Notes (Signed)
Pt moved to room 33. Pt alert and looks at this nurse when spoken to but not answering questions at this time. No acute distress noted. Pt on monitor with IV antibiotics infusing. Will continue to assess.

## 2019-10-17 NOTE — Progress Notes (Signed)
Pharmacy Antibiotic Note  Jeremiah James is a 86 y.o. male admitted on 10/17/2019 with sepsis/?UTI-complicated.  Pharmacy has been consulted for Zosyn dosing.  Plan: Zosyn 3.375g IV q8h (4 hour infusion).    Height: 5\' 8"  (172.7 cm) Weight: 84.1 kg (185 lb 6.5 oz) IBW/kg (Calculated) : 68.4  Temp (24hrs), Avg:99.9 F (37.7 C), Min:99.2 F (37.3 C), Max:100.6 F (38.1 C)  Recent Labs  Lab 10/17/19 1057  WBC 14.7*  CREATININE 1.78*  LATICACIDVEN 1.9    Estimated Creatinine Clearance: 32.6 mL/min (A) (by C-G formula based on SCr of 1.78 mg/dL (H)).    Allergies  Allergen Reactions  . Morphine Rash    unknown  . Hydrocodone-Acetaminophen Rash    agitation    Antimicrobials this admission: Cefepime/Vancomycin/[place] 9/21 x 1 each Zosyn 9/21 >>  Dose adjustments this admission:    Microbiology results: 9/21 BCx: pend 9/21 UCx: pend    Sputum:      MRSA PCR:    Thank you for allowing pharmacy to be a part of this patient's care.  Jeremiah James A 10/17/2019 4:35 PM

## 2019-10-18 DIAGNOSIS — R6520 Severe sepsis without septic shock: Secondary | ICD-10-CM

## 2019-10-18 DIAGNOSIS — A419 Sepsis, unspecified organism: Principal | ICD-10-CM

## 2019-10-18 DIAGNOSIS — N390 Urinary tract infection, site not specified: Secondary | ICD-10-CM

## 2019-10-18 DIAGNOSIS — N179 Acute kidney failure, unspecified: Secondary | ICD-10-CM

## 2019-10-18 LAB — MRSA PCR SCREENING: MRSA by PCR: POSITIVE — AB

## 2019-10-18 LAB — CBC
HCT: 33 % — ABNORMAL LOW (ref 39.0–52.0)
Hemoglobin: 11 g/dL — ABNORMAL LOW (ref 13.0–17.0)
MCH: 32.1 pg (ref 26.0–34.0)
MCHC: 33.3 g/dL (ref 30.0–36.0)
MCV: 96.2 fL (ref 80.0–100.0)
Platelets: 190 10*3/uL (ref 150–400)
RBC: 3.43 MIL/uL — ABNORMAL LOW (ref 4.22–5.81)
RDW: 13.6 % (ref 11.5–15.5)
WBC: 14.5 10*3/uL — ABNORMAL HIGH (ref 4.0–10.5)
nRBC: 0 % (ref 0.0–0.2)

## 2019-10-18 LAB — COMPREHENSIVE METABOLIC PANEL
ALT: 13 U/L (ref 0–44)
AST: 32 U/L (ref 15–41)
Albumin: 2.8 g/dL — ABNORMAL LOW (ref 3.5–5.0)
Alkaline Phosphatase: 42 U/L (ref 38–126)
Anion gap: 11 (ref 5–15)
BUN: 29 mg/dL — ABNORMAL HIGH (ref 8–23)
CO2: 25 mmol/L (ref 22–32)
Calcium: 8.7 mg/dL — ABNORMAL LOW (ref 8.9–10.3)
Chloride: 104 mmol/L (ref 98–111)
Creatinine, Ser: 1.35 mg/dL — ABNORMAL HIGH (ref 0.61–1.24)
GFR calc Af Amer: 55 mL/min — ABNORMAL LOW (ref >60)
GFR calc non Af Amer: 48 mL/min — ABNORMAL LOW (ref >60)
Glucose, Bld: 114 mg/dL — ABNORMAL HIGH (ref 70–99)
Potassium: 4.9 mmol/L (ref 3.5–5.1)
Sodium: 140 mmol/L (ref 135–145)
Total Bilirubin: 1.1 mg/dL (ref 0.3–1.2)
Total Protein: 6.6 g/dL (ref 6.5–8.1)

## 2019-10-18 LAB — LACTIC ACID, PLASMA: Lactic Acid, Venous: 1.4 mmol/L (ref 0.5–1.9)

## 2019-10-18 LAB — PROTIME-INR
INR: 2.6 — ABNORMAL HIGH (ref 0.8–1.2)
Prothrombin Time: 27.2 seconds — ABNORMAL HIGH (ref 11.4–15.2)

## 2019-10-18 LAB — AMMONIA: Ammonia: 12 umol/L (ref 9–35)

## 2019-10-18 MED ORDER — LACTATED RINGERS IV BOLUS
1000.0000 mL | Freq: Once | INTRAVENOUS | Status: AC
  Administered 2019-10-18: 1000 mL via INTRAVENOUS

## 2019-10-18 MED ORDER — SODIUM CHLORIDE 0.9 % IV SOLN
1.0000 g | INTRAVENOUS | Status: DC
  Administered 2019-10-18: 1 g via INTRAVENOUS
  Filled 2019-10-18: qty 10
  Filled 2019-10-18: qty 1

## 2019-10-18 NOTE — Progress Notes (Signed)
 on bladder scan.

## 2019-10-18 NOTE — ED Notes (Signed)
Bladder scan performed per MD request. Pt tolerated well. >366 ml of urine see initially; pt then voided large amount and scan reported volume of 60 and 5ml. Small amount of blood noted to tip of penis with small clots present. Pt changed and repositioned. Tolerated well. MD aware.

## 2019-10-18 NOTE — ED Notes (Addendum)
pt

## 2019-10-18 NOTE — Progress Notes (Addendum)
ANTICOAGULATION CONSULT NOTE - Initial Consult  Pharmacy Consult for Warfarin Indication: atrial fibrillation  Allergies  Allergen Reactions  . Morphine Rash    unknown  . Hydrocodone-Acetaminophen Rash    agitation    Patient Measurements: Height: 5\' 8"  (172.7 cm) Weight: 67.1 kg (147 lb 14.4 oz) IBW/kg (Calculated) : 68.4 Heparin Dosing Weight:    Vital Signs: Temp: 99 F (37.2 C) (09/22 1136) Temp Source: Oral (09/22 1136) BP: 131/69 (09/22 1136) Pulse Rate: 109 (09/22 1136)  Labs: Recent Labs    10/17/19 1057 10/18/19 0510  HGB 13.5 11.0*  HCT 39.3 33.0*  PLT 249 190  APTT 49*  --   LABPROT 25.1* 27.2*  INR 2.4* 2.6*  CREATININE 1.78* 1.35*    Estimated Creatinine Clearance: 38.7 mL/min (A) (by C-G formula based on SCr of 1.35 mg/dL (H)).   Medical History: Past Medical History:  Diagnosis Date  . A-fib (HCC)   . MI (myocardial infarction) (HCC)    x6    Medications:  Scheduled:  . metoprolol succinate  50 mg Oral Daily  . tamsulosin  0.4 mg Oral QHS  . Warfarin - Pharmacist Dosing Inpatient   Does not apply q1600   Infusions:  . piperacillin-tazobactam (ZOSYN)  IV 3.375 g (10/18/19 0533)    Home dose: Warfarin 2.5 mg po at bedtime  Assessment: 86 yo M admitted for AMS with PMH significant for dementia, TIA, CAD s/p DES, combined CHF, and Afib on Warfarin. Patient noted to have blood on tip of penis with small clots present and hematuria. Patient now has foley/in-out cath at this time. Pharmacy has been consulted for warfarin dosing and monitoring.   Hgb 13.5>11; Plt 249>190  Date INR Dose 9/21 2.4  Held  9/22 2.6    Goal of Therapy:  INR 2-3 Monitor platelets by anticoagulation protocol: Yes   Plan:  INR is therapeutic despite holding home dose. Will continue to hold warfarin tonight given hematuria and discussion with MD; awaiting input from urology. Monitor INR and CBC with AM labs   10/22, PharmD Pharmacy Resident   10/18/2019 11:46 AM

## 2019-10-18 NOTE — Progress Notes (Signed)
Bladder scan yielded .  MD at bedside and did not want a foley/in-out cath at this time. Continue to monitor until urine shows greater than and notify MD for new orders.

## 2019-10-18 NOTE — ED Notes (Addendum)
Pt depends changed with small amount of stool and urine noted. approx nickel sized blood clot with quarter sized blood stain on depends noted from penis. Pt cleaned and provided for comfort and safety. Turned pt onto left side slightly. IV antibiotics infusing without difficulty. MD made aware.

## 2019-10-18 NOTE — Progress Notes (Signed)
ANTICOAGULATION CONSULT NOTE - Initial Consult  Pharmacy Consult for Warfarin Indication: atrial fibrillation  Allergies  Allergen Reactions  . Morphine Rash    unknown  . Hydrocodone-Acetaminophen Rash    agitation    Patient Measurements: Height: 5\' 8"  (172.7 cm) Weight: 84.1 kg (185 lb 6.5 oz) IBW/kg (Calculated) : 68.4 Heparin Dosing Weight:    Vital Signs: Temp: 102.1 F (38.9 C) (09/21 2325) Temp Source: Rectal (09/21 2325) BP: 100/47 (09/22 0005) Pulse Rate: 112 (09/22 0005)  Labs: Recent Labs    10/17/19 1057  HGB 13.5  HCT 39.3  PLT 249  APTT 49*  LABPROT 25.1*  INR 2.4*  CREATININE 1.78*    Estimated Creatinine Clearance: 32.6 mL/min (A) (by C-G formula based on SCr of 1.78 mg/dL (H)).   Medical History: Past Medical History:  Diagnosis Date  . A-fib (HCC)   . MI (myocardial infarction) (HCC)    x6    Medications:  Scheduled:  . metoprolol succinate  50 mg Oral Daily  . tamsulosin  0.4 mg Oral QHS  . Warfarin - Pharmacist Dosing Inpatient   Does not apply q1600   Infusions:  . lactated ringers    . lactated ringers Stopped (10/17/19 1232)  . piperacillin-tazobactam (ZOSYN)  IV Stopped (10/17/19 2353)    Assessment: 86 yo M on Warfarin for Afib PTA. Home dose: Warfarin 2.5 mg po at bedtime Hgb 13.5 Plt 249  9/21 INR 2.4    Goal of Therapy:  INR 2-3 Monitor platelets by anticoagulation protocol: Yes   Plan:  Will continue patient's home dose of Warfarin 2.5 mg PO Daily.  DDI: abx (Zosyn) Will f/u INR in am  9/22:  Warfarin d/c'd per NP , RN reports pt having hematuria.  Will nedd to f/u plans to resume warfarin with provider on 9/22.   Kaiden Pech D 10/18/2019,12:26 AM

## 2019-10-18 NOTE — Progress Notes (Addendum)
PROGRESS NOTE    Devontre Siedschlag   KGY:[id number]  DOB: Dec 16, 1933  PCP: Lauro Regulus, MD    DOA: 10/17/2019 LOS: 1   Brief Narrative   Ilias Stcharles is a 86 y.o. male with medical history significant for dementia, a-fib on warfarin, TIA, CAD s/p DES, combined CHF (2020 TTE EF 35-40 w/ grade 3 DD), BPH, gout, HTN, who presented to the ED from [facility] with altered mental status.  Daughter provided history of several months of slowly declining cognition.  Baseline patient up in wheelchair, conversant but usually confused.  Patient had been eating and drinking less, not getting out of bed, and more confused than usual.    Evaluation in the ED showed leukocytosis with left shift, elevated creatinine 1.78.  CT abdomen/pelvis findings consistent with cystitis and urinanalysis.  Patient was febrile 100.6 F, in A-fib with RVR at 111 bpm.  Lactic acid normal.  Treated with IV fluids and broad spectrum antibiotics in the ED.  Admitted to hospitalist service for further management of severe sepsis secondary to UTI.     Assessment & Plan   Principal Problem:   Complicated UTI (urinary tract infection) Active Problems:   Atrial fibrillation (HCC)   Coronary artery disease involving native coronary artery of native heart without angina pectoris   Transient ischemic attack   Sepsis (HCC)  Severe sepsis secondary to Acute Complicated UTI - sepsis present on admission as evidenced by fever, tachycardia, leukocytosis, with UA and imaging suggestive on UTI.  Organ dysfunction as evidenced by AKI.  Normal lactic acid.   Treated per sepsis protocol in the ED with IV fluids and broad spectrum antibiotics.   --Continue Zosyn --Follow urine and blood cultures --Tylenol PRN fevers --completed IV fluids, consider gentle maintenance if inadequate PO intake  Acute Kidney Injury - present on admission with Cr 1.78 (vs 1.1-1.3 last year). Likely pre-renal azotemia in setting of  infection. Improving.  Cr 1.78 >> 1.35 today.  Monitor BMP.  Avoid nephrotoxins and hypotension.  Renally dose meds as indicated.  Atrial fibrillation with RVR - presented in A-fib at 110 bpm, likely due to sepsis/infection.   --Continue home metoprolol --telemetry --warfarin per pharmacy  --monitor INR   Chronic combined systolic/diastolic CHF - not acutely decompensated on admission.  Prior echo with EF 35-40%, grade III diastolic dysfunction, mod MR.  Continue metoprolol.  Losartan and Lasix on hold due to AKI.  Coronary artery disease s/p strent - stable.  Not on ASA at home.  Essential hypertension - hold losartan due to AKI.  Monitor BP.  Maintain MAP>65.  BPH - continue Flomax. 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  Outpatient f/u, given advanced dementia likely reasonable to defer  Dementia - so far no behavioral disturbances.  Hold home olanzapine and depakote until safe taking PO. --SLP consult for swallow evaluation   DVT prophylaxis: on warfarin   Diet:  Diet Orders (From admission, onward)    Start     Ordered   10/17/19 1655  Diet NPO time specified Except for: Sips with Meds  Diet effective now       Question:  Except for  Answer:  Clearance Coots with Meds   10/17/19 1655            Code Status: DNR    Subjective 10/18/19    Patient seen this AM with nursing at bedside.  Bladder scan showing 339  cc.  Patient does answer questions but only yes/no.  He denies pain or discomfort at this time.  No acute events reported.   Disposition Plan & Communication   Status is: Inpatient  Remains inpatient appropriate because:IV treatments appropriate due to intensity of illness or inability to take PO   Dispo: The patient is from: SNF              Anticipated d/c is to: SNF              Anticipated d/c date is: 2 days              Patient currently is not medically stable to d/c.         Family Communication: none at bedside will attempt to call.    Consults, Procedures, Significant Events   Consultants:   None  Procedures:   None  Antimicrobials:  Anti-infectives (From admission, onward)   Start     Dose/Rate Route Frequency Ordered Stop   10/17/19 2000  piperacillin-tazobactam (ZOSYN) IVPB 3.375 g        3.375 g 12.5 mL/hr over 240 Minutes Intravenous Every 8 hours 10/17/19 1634     10/17/19 1215  ceFEPIme (MAXIPIME) 2 g in sodium chloride 0.9 % 100 mL IVPB        2 g 200 mL/hr over 30 Minutes Intravenous  Once 10/17/19 1206 10/17/19 1300   10/17/19 1215  metroNIDAZOLE (FLAGYL) IVPB 500 mg        500 mg 100 mL/hr over 60 Minutes Intravenous  Once 10/17/19 1206 10/17/19 1500   10/17/19 1215  vancomycin (VANCOCIN) IVPB 1000 mg/200 mL premix        1,000 mg 200 mL/hr over 60 Minutes Intravenous  Once 10/17/19 1206 10/17/19 1655        Objective   Vitals:   10/18/19 0054 10/18/19 0200 10/18/19 0302 10/18/19 0304  BP: 111/62 (!) 106/53 (!) 136/93   Pulse: (!) 111 99 98   Resp: (!) 22  (!) 22   Temp: (!) 101 F (38.3 C)  98.9 F (37.2 C)   TempSrc: Rectal  Oral   SpO2: 92% 95% 92%   Weight:    67.1 kg  Height:    5\' 8"  (1.727 m)    Intake/Output Summary (Last 24 hours) at 10/18/2019 0807 Last data filed at 10/17/2019 1500 Gross per 24 hour  Intake 2200 ml  Output --  Net 2200 ml   Filed Weights   10/17/19 1033 10/18/19 0304  Weight: 84.1 kg 67.1 kg    Physical Exam:  General exam: awake, alert, no acute distress Respiratory system: CTAB, no wheezes, rales or rhonchi, normal respiratory effort. Cardiovascular system: normal S1/S2, RRR, no pedal edema.   Gastrointestinal system: soft, NT, ND, no HSM felt, +bowel sounds. Extremities: moves all, no cyanosis, normal tone Skin: dry, intact, normal temperature, normal color   Labs   Data Reviewed: I have personally reviewed following labs and imaging studies  CBC: Recent Labs  Lab  10/17/19 1057 10/18/19 0510  WBC 14.7* 14.5*  NEUTROABS 12.2*  --   HGB 13.5 11.0*  HCT 39.3 33.0*  MCV 92.5 96.2  PLT 249 190   Basic Metabolic Panel: Recent Labs  Lab 10/17/19 1057 10/18/19 0510  NA 140 140  K 4.1 4.9  CL 101 104  CO2 26 25  GLUCOSE 127* 114*  BUN 31* 29*  CREATININE 1.78* 1.35*  CALCIUM 9.0 8.7*   GFR: Estimated Creatinine Clearance:  38.7 mL/min (A) (by C-G formula based on SCr of 1.35 mg/dL (H)). Liver Function Tests: Recent Labs  Lab 10/17/19 1057 10/18/19 0510  AST 32 32  ALT 16 13  ALKPHOS 58 42  BILITOT 0.8 1.1  PROT 8.3* 6.6  ALBUMIN 3.6 2.8*   No results for input(s): LIPASE, AMYLASE in the last 168 hours. No results for input(s): AMMONIA in the last 168 hours. Coagulation Profile: Recent Labs  Lab 10/17/19 1057 10/18/19 0510  INR 2.4* 2.6*   Cardiac Enzymes: No results for input(s): CKTOTAL, CKMB, CKMBINDEX, TROPONINI in the last 168 hours. BNP (last 3 results) No results for input(s): PROBNP in the last 8760 hours. HbA1C: No results for input(s): HGBA1C in the last 72 hours. CBG: No results for input(s): GLUCAP in the last 168 hours. Lipid Profile: No results for input(s): CHOL, HDL, LDLCALC, TRIG, CHOLHDL, LDLDIRECT in the last 72 hours. Thyroid Function Tests: No results for input(s): TSH, T4TOTAL, FREET4, T3FREE, THYROIDAB in the last 72 hours. Anemia Panel: No results for input(s): VITAMINB12, FOLATE, FERRITIN, TIBC, IRON, RETICCTPCT in the last 72 hours. Sepsis Labs: Recent Labs  Lab 10/17/19 1057 10/18/19 0510  LATICACIDVEN 1.9 1.4    Recent Results (from the past 240 hour(s))  Blood culture (routine x 2)     Status: None (Preliminary result)   Collection Time: 10/17/19 10:57 AM   Specimen: BLOOD  Result Value Ref Range Status   Specimen Description BLOOD LEFT FA  Final   Special Requests   Final    BOTTLES DRAWN AEROBIC AND ANAEROBIC Blood Culture results may not be optimal due to an excessive volume of  blood received in culture bottles   Culture   Final    NO GROWTH < 24 HOURS Performed at [facility], [address] [place], [place] [address]    Report Status PENDING  Incomplete  Blood culture (routine x 2)     Status: None (Preliminary result)   Collection Time: 10/17/19 10:57 AM   Specimen: BLOOD  Result Value Ref Range Status   Specimen Description BLOOD RIGHT [facility]  Final   Special Requests   Final    BOTTLES DRAWN AEROBIC AND ANAEROBIC Blood Culture adequate volume   Culture   Final    NO GROWTH < 24 HOURS Performed at [facility], [address] [place], [place] [address]    Report Status PENDING  Incomplete  SARS Coronavirus 2 by RT PCR (hospital order, performed in [facility] hospital lab) Nasopharyngeal Nasopharyngeal Swab     Status: None   Collection Time: 10/17/19  4:09 PM   Specimen: Nasopharyngeal Swab  Result Value Ref Range Status   SARS Coronavirus 2 NEGATIVE NEGATIVE Final    Comment: (NOTE) SARS-CoV-2 target nucleic acids are NOT DETECTED.  The SARS-CoV-2 RNA is generally detectable in upper and lower respiratory specimens during the acute phase of infection. The lowest concentration of SARS-CoV-2 viral copies this assay can detect is 250 copies / mL. A negative result does not preclude SARS-CoV-2 infection and should not be used as the sole basis for treatment or other patient management decisions.  A negative result may occur with improper specimen collection / handling, submission of specimen other than nasopharyngeal swab, presence of viral mutation(s) within the areas targeted by this assay, and inadequate number of viral copies (<250 copies / mL). A negative result must be combined with clinical observations, patient history, and epidemiological information.  Fact Sheet for Patients:   BoilerBrush.com.cy  Fact  Sheet for Healthcare Providers: https://pope.com/  This test is not  yet approved or  cleared by the [place] and has been authorized for detection and/or diagnosis of SARS-CoV-2 by FDA under an Emergency Use Authorization (EUA).  This EUA will remain in effect (meaning this test can be used) for the duration of the COVID-19 declaration under Section 564(b)(1) of the Act, 21 U.S.C. section 360bbb-3(b)(1), unless the authorization is terminated or revoked sooner.  Performed at [facility], [address], [place], [place] [address]       Imaging Studies   CT ABDOMEN PELVIS WO CONTRAST  Result Date: 10/17/2019 CLINICAL DATA:  Altered mental status.  Poor oral intake EXAM: CT ABDOMEN AND PELVIS WITHOUT CONTRAST TECHNIQUE: Multidetector CT imaging of the abdomen and pelvis was performed following the standard protocol without IV contrast. COMPARISON:  10/19/2017 FINDINGS: Lower chest: Bibasilar scarring or atelectasis. Cardiomegaly. Trace pericardial effusion. Coronary artery calcification. Hepatobiliary: Multiple probable cysts the liver appear unchanged the prior study. No new focal hepatic lesion is identified on noncontrast study. Status post cholecystectomy. No biliary dilatation. Pancreas: Along the anterior margin of the pancreatic head/neck is a ovoid mass measuring 2.3 x 2.0 x 2.1 cm measuring fluid density (series 3, image 23; series 7, image 59). No pancreatic ductal dilatation. No peripancreatic inflammatory changes. Spleen: Normal in size without focal abnormality. Adrenals/Urinary Tract: Adrenal glands within normal limits. 1.5 cm right renal cyst. Vascular calcifications within the bilateral renal hila. No definite renal stone. No hydronephrosis. Bilateral ureters are unremarkable. Mild circumferential thickening of the urinary bladder wall, which may be accentuated by underdistention. Stomach/Bowel: Stomach is within normal limits. Appendix appears normal. Sigmoid diverticulosis. No evidence of bowel wall thickening, distention, or  inflammatory changes. Mildly prominent rectal stool ball. Vascular/Lymphatic: Aortic atherosclerosis. No enlarged abdominal or pelvic lymph nodes. Reproductive: Prostate gland appears within normal limits. Other: No free fluid. No abdominopelvic fluid collection. No pneumoperitoneum. No abdominal wall hernia. Musculoskeletal: L1 superior endplate Schmorl's node with associated age indeterminate mild compression fracture (series 7, image 49), new from prior. No bony retropulsion. Advanced degenerative disc disease and facet arthropathy throughout the lumbar spine. IMPRESSION: 1. Mild circumferential thickening of the urinary bladder wall, which may be accentuated by underdistention. Correlate with urinalysis to exclude cystitis. 2. Sigmoid diverticulosis without evidence of acute diverticulitis. 3. L1 superior endplate Schmorl's node with associated age-indeterminate mild compression fracture, new from prior. No bony retropulsion. Correlate with point tenderness. 4. Along the anterior margin of the pancreatic head/neck is a 2.3 cm ovoid mass measuring fluid density. Findings may represent a cystic pancreatic neoplasm or possibly a pseudocyst. A nonemergent MRI/MRCP or contrast enhanced CT of the abdomen is suggested for baseline to guide/determine future follow-up and management. 5. Cardiomegaly with trace pericardial effusion. 6. Aortic atherosclerosis. (ICD10-I70.0). Electronically Signed   By: Duanne Guess D.O.   On: 10/17/2019 15:37   CT Head Wo Contrast  Result Date: 10/17/2019 CLINICAL DATA:  Mental status change. EXAM: CT HEAD WITHOUT CONTRAST TECHNIQUE: Contiguous axial images were obtained from the base of the skull through the vertex without intravenous contrast. COMPARISON:  11/14/2017 FINDINGS: Brain: No evidence of acute infarction, hemorrhage, hydrocephalus, extra-axial collection or mass lesion/mass effect. Advanced atrophy and chronic microvascular ischemic changes are again noted. These  appear to have progressed since 2019. Bilateral basal ganglia lacunar infarcts are again noted. Vascular: No hyperdense vessel or unexpected calcification. Skull: Normal. Negative for fracture or focal lesion. Sinuses/Orbits: No acute finding. Other: None. IMPRESSION: 1.  No acute intracranial abnormality. 2. Advanced atrophy and chronic microvascular ischemic changes are again noted. These appear to have progressed since 2019. Electronically Signed   By: Katherine Mantle M.D.   On: 10/17/2019 15:20   DG Chest Port 1 View  Result Date: 10/17/2019 CLINICAL DATA:  Altered mental status EXAM: PORTABLE CHEST 1 VIEW COMPARISON:  November 14, 2017 FINDINGS: The cardiomediastinal silhouette is unchanged and enlarged in contour.Atherosclerotic calcifications of the aorta. No pleural effusion. No pneumothorax. No acute pleuroparenchymal abnormality. Unchanged bibasilar atelectasis. Visualized abdomen is unremarkable. Multilevel degenerative changes of the thoracic spine. IMPRESSION: No acute cardiopulmonary abnormality. Electronically Signed   By: Meda Klinefelter MD   On: 10/17/2019 11:08     Medications   Scheduled Meds: . metoprolol succinate  50 mg Oral Daily  . tamsulosin  0.4 mg Oral QHS  . Warfarin - Pharmacist Dosing Inpatient   Does not apply q1600   Continuous Infusions: . piperacillin-tazobactam (ZOSYN)  IV 3.375 g (10/18/19 0533)       LOS: 1 day    Time spent: 30 minutes with > 50% spent in coordination of care and direct patient contact.    Pennie Banter, DO [facility]  10/18/2019, 8:07 AM    If 7PM-7AM, please contact night-coverage. How to contact the [facility] Attending or Consulting provider 7A - 7P or covering provider during after hours 7P -7A, for this patient?    1. Check the care team in [facility] and look for a) attending/consulting [facility] provider listed and b) the [facility] team listed 2. Log into [email or web address] and use [facility]'s universal password to access. If you  do not have the password, please contact the hospital operator. 3. Locate the [facility] provider you are looking for under [facility] and page to a number that you can be directly reached. 4. If you still have difficulty reaching the provider, please page the [facility] (Director on Call) for the Hospitalists listed on amion for assistance.

## 2019-10-18 NOTE — Hospital Course (Signed)
Jeremiah James is a 86 y.o. male with medical history significant for dementia, a-fib on warfarin, TIA, CAD s/p DES, combined CHF (2020 TTE EF 35-40 w/ grade 3 DD), BPH, gout, HTN, who presented to the ED from [facility] with altered mental status.  Daughter provided history of several months of slowly declining cognition.  Baseline patient up in wheelchair, conversant but usually confused.  Patient had been eating and drinking less, not getting out of bed, and more confused than usual.    Evaluation in the ED showed leukocytosis with left shift, elevated creatinine 1.78.  CT abdomen/pelvis findings consistent with cystitis and urinanalysis.  Patient was febrile 100.6 F, in A-fib with RVR at 111 bpm.  Lactic acid normal.  Treated with IV fluids and broad spectrum antibiotics in the ED.  Admitted to hospitalist service for further management of severe sepsis secondary to UTI.

## 2019-10-18 NOTE — Evaluation (Signed)
Clinical/Bedside Swallow Evaluation Patient Details  Name: Jeremiah James MRN: [id number] Date of Birth: Jul 12, 1933  Today's Date: 10/18/2019 Time: SLP Start Time (ACUTE ONLY): 1459 SLP Stop Time (ACUTE ONLY): 1545 SLP Time Calculation (min) (ACUTE ONLY): 46 min  Past Medical History:  Past Medical History:  Diagnosis Date  . A-fib (HCC)   . MI (myocardial infarction) (HCC)    x6   Past Surgical History: History reviewed. No pertinent surgical history. HPI:  Pt is a 86 y.o. 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 Has been admitted for sepsis, UTI in the past. Daughter says patient has had several months of slow decline but at baseline sits up in wheelchair, is conversant but frequently confused. Per report several days of altered mental status, eating and drinking less, not getting out of bed. Daughter is concerned about over-medication at the [facility].   Assessment / Plan / Recommendation Clinical Impression  Pt appears to present w/ potential, Mild oropharyngeal phase dysphagia suspect d/t impact of declined Cognitive status; Dementia baseline. Dementia can impact a pt's overall awareness/engagement and pharyngeal swallow timing during po intakewhich increases risk for aspiration. Risk for aspiration appears reduced when following general aspiration precautions, feeding assistance, and use of a modified diet. He requires verbal/visualcues for orientation to bolus presentation, and during follow through w/ po tasks. He verbalized likes and dislikes to SLP, Dtr. Pt consumed po trials of ice chips, purees, and TSP/Cup sips of Nectar consistency liquids w/ No immediate, overt clinical s/s of aspiration noted; no decline in vocal quality during few verbalizations, no cough, and no decline in respiratory status during/post trials. Oral phase was adequate for bolus  management and oral clearing of the boluses given. He required Min extra Time for full oral clearing as he gave attention w/ lingual sweeping. OM Exam was cursory/observation, but No unilateral weakness noted during bolus management and lingual protrusion. Pt required feeding support -- full support d/t shaky UEs; need for attention to tasks. D/t pt's declined Cognitive status, his risk for aspiration, and current illness, recommend initiation of the dysphagia level 1(puree) w/ Nectar liquids; aspiration precautions; reduce Distractions during meals. Pills Crushed in Puree for safer swallowing. Support w/ feeding at all meals -- check for oral clearing during/post intake. NSG updated. ST services will f/u w/ pt w/ ongoing trials to safely upgrade diet to least restrictive consistency. Education completed w/ Dtr; Precautions posted in room.  SLP Visit Diagnosis: Dysphagia, oropharyngeal phase (R13.12) (baseline Dementia)    Aspiration Risk  Mild aspiration risk;Risk for inadequate nutrition/hydration    Diet Recommendation  Dysphagia level 1 (puree) w/ gravies; Nectar liquids. General aspiration precautions. Full feeding support and supervision at all meals for safety w/ oral intake secondary to Baseline Cognitive decline/Dementia  Medication Administration: Crushed with puree (for safer swallowing)    Other  Recommendations Recommended Consults:  (Dietician f/u) Oral Care Recommendations: Oral care BID;Oral care before and after PO;Staff/trained caregiver to provide oral care Other Recommendations: Order thickener from pharmacy;Prohibited food (jello, ice cream, thin soups);Remove water pitcher;Have oral suction available   Follow up Recommendations Skilled Nursing facility (TBD)      Frequency and Duration min 3x week  2 weeks       Prognosis Prognosis for Safe Diet Advancement: Fair Barriers to Reach Goals: Cognitive deficits;Time post onset;Severity of deficits      Swallow  Study    General Date of Onset: 10/17/19 HPI: Pt is a 86 y.o. 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 Has been admitted for sepsis, UTI in the past. Daughter says patient has had several months of slow decline but at baseline sits up in wheelchair, is conversant but frequently confused. Per report several days of altered mental status, eating and drinking less, not getting out of bed. Daughter is concerned about over-medication at the [facility]. Type of Study: Bedside Swallow Evaluation Previous Swallow Assessment: none reported Diet Prior to this Study: NPO (regular diet at the NH) Temperature Spikes Noted: No (wbc 14.5 declining) Respiratory Status: Room air History of Recent Intubation: No Behavior/Cognition: Alert;Cooperative;Pleasant mood;Confused;Distractible;Requires cueing (Baseline Dementia) Oral Cavity Assessment: Dry (sticky) Oral Care Completed by SLP: Yes Oral Cavity - Dentition: Adequate natural dentition Vision: Functional for self-feeding Self-Feeding Abilities: Able to feed self;Needs assist;Needs set up;Total assist (UE weakness) Patient Positioning: Upright in bed (needed complete positioning) Baseline Vocal Quality: Low vocal intensity Volitional Cough: Cognitively unable to elicit Volitional Swallow: Unable to elicit    Oral/Motor/Sensory Function Overall Oral Motor/Sensory Function: Within functional limits (and w/ bolus management)   Ice Chips Ice chips: Within functional limits Presentation: Spoon (fed; 5 trials)   Thin Liquid Thin Liquid: Not tested    Nectar Thick Nectar Thick Liquid: Within functional limits Presentation: Cup;Self Fed;Spoon (supported; 10+)   Honey Thick Honey Thick Liquid: Not tested   Puree Puree: Within functional limits Presentation: Spoon (10+ trials)   Solid     Solid: Not tested        Jerilynn Som,  MS, CCC-SLP Speech Language Pathologist Rehab Services [phone] Azure Barrales 10/18/2019,3:53 PM

## 2019-10-18 NOTE — ED Notes (Signed)
Report called to Ethelene Browns RN, pt admitted to 2A

## 2019-10-19 LAB — CBC
HCT: 31.3 % — ABNORMAL LOW (ref 39.0–52.0)
Hemoglobin: 10.6 g/dL — ABNORMAL LOW (ref 13.0–17.0)
MCH: 31.6 pg (ref 26.0–34.0)
MCHC: 33.9 g/dL (ref 30.0–36.0)
MCV: 93.4 fL (ref 80.0–100.0)
Platelets: 222 10*3/uL (ref 150–400)
RBC: 3.35 MIL/uL — ABNORMAL LOW (ref 4.22–5.81)
RDW: 13.2 % (ref 11.5–15.5)
WBC: 11.2 10*3/uL — ABNORMAL HIGH (ref 4.0–10.5)
nRBC: 0 % (ref 0.0–0.2)

## 2019-10-19 LAB — BASIC METABOLIC PANEL
Anion gap: 10 (ref 5–15)
BUN: 27 mg/dL — ABNORMAL HIGH (ref 8–23)
CO2: 26 mmol/L (ref 22–32)
Calcium: 8.6 mg/dL — ABNORMAL LOW (ref 8.9–10.3)
Chloride: 106 mmol/L (ref 98–111)
Creatinine, Ser: 1.1 mg/dL (ref 0.61–1.24)
GFR calc Af Amer: >60 mL/min (ref >60)
GFR calc non Af Amer: >60 mL/min (ref >60)
Glucose, Bld: 100 mg/dL — ABNORMAL HIGH (ref 70–99)
Potassium: 3.6 mmol/L (ref 3.5–5.1)
Sodium: 142 mmol/L (ref 135–145)

## 2019-10-19 LAB — URINE CULTURE: Culture: >=100000 — AB

## 2019-10-19 LAB — PROTIME-INR
INR: 3.1 — ABNORMAL HIGH (ref 0.8–1.2)
Prothrombin Time: 30.6 seconds — ABNORMAL HIGH (ref 11.4–15.2)

## 2019-10-19 LAB — MAGNESIUM: Magnesium: 2.1 mg/dL (ref 1.7–2.4)

## 2019-10-19 MED ORDER — POTASSIUM CHLORIDE CRYS ER 20 MEQ PO TBCR: 40.0000 meq | EXTENDED_RELEASE_TABLET | Freq: Once | ORAL | Status: DC

## 2019-10-19 MED ORDER — SODIUM CHLORIDE 0.9 % IV SOLN: INTRAVENOUS | Status: DC | PRN

## 2019-10-19 MED ORDER — FOSFOMYCIN TROMETHAMINE 3 G PO PACK
3.0000 g | PACK | Freq: Once | ORAL | Status: AC
  Administered 2019-10-19: 3 g via ORAL
  Filled 2019-10-19: qty 3

## 2019-10-19 MED ORDER — POTASSIUM CHLORIDE 20 MEQ/15ML (10%) PO SOLN
40.0000 meq | Freq: Once | ORAL | Status: AC
  Administered 2019-10-19: 40 meq via ORAL
  Filled 2019-10-19: qty 30

## 2019-10-19 MED ORDER — MUPIROCIN 2 % EX OINT
1.0000 "application " | TOPICAL_OINTMENT | Freq: Two times a day (BID) | CUTANEOUS | Status: DC
  Administered 2019-10-19 – 2019-10-20 (×4): 1 via NASAL
  Filled 2019-10-19: qty 22

## 2019-10-19 MED ORDER — CHLORHEXIDINE GLUCONATE CLOTH 2 % EX PADS
6.0000 | MEDICATED_PAD | Freq: Every day | CUTANEOUS | Status: DC
  Administered 2019-10-19: 6 via TOPICAL

## 2019-10-19 NOTE — Progress Notes (Signed)
Speech Language Pathology Treatment: Dysphagia  Patient Details Name: Jeremiah James MRN: [id number] DOB: 11-Mar-1933 Today's Date: 10/19/2019 Time: 1350-1430 SLP Time Calculation (min) (ACUTE ONLY): 40 min  Assessment / Plan / Recommendation Clinical Impression  Pt seen for ongoing assessment of swallowing; trials to upgrade initial dysphagia diet per evaluation yesterday. He appears much improved and alert today; more verbally responsive and able to follow instructions w/ min cues. Pt is on RA; wbc improving per labs. NSG reported good toleration of the dysphagia diet at meals today w/ no overt s/s of aspiration noted during intake.  Pt explained general aspiration precautions and agreed verbally to the need for following them especially sitting upright for all oral intake. Pt assisted w/ positioning d/t weakness then given trials of thin and nectar liquids, ice chips, purees and soft solids. Overt clinical s/s of aspiration were noted w/ trials of Thin liquids via Cup -- immediate coughing occurred fairly consistently. Pt appeared to exhibit a less coordinated swallow -- suspect less oropharyngeal control of the thinner viscosity. One coughing episode required extra Time for pt to calm his breathing. W/ trials of the Nectar consistency liquids(and purees, softened solids), No overt clinical s/s of aspiration were noted; respiratory status remained calm and unlabored, vocal quality clear b/t trials. Pt held Cup when drinking following instructions for single, small sips slowly. NO straws were utiilized for better oral control, and pt stated he does Not use straws normally when drinking liquids. Oral phase appeared grossly [facility] for bolus management, mastication, and timely A-P transfer for swallowing including w/ trials of mech soft solids moistened well; oral clearing achieved w/ all consistencies given min Time b/t the trials of solids. Also alternated w/ tsps of puree to aid clearing.  Recommend upgrade  to Dysphagia level 3 diet (mech soft) w/ gravies added to moisten foods; but remain on Nectar consistency liquids for safer oral intake of liquids. Recommend general aspiration precautions; Pills Whole in Puree as able vs Crushing; tray setup and positioning assistance for meals w/ feeding assistance as needed d/t weakness and Cognitive decline. ST services will continue to f/u w/ pt for toleration of diet and education as needed while admitted. This diet consistency is recommended at discharge as pt appears at increased risk for aspiration, Pulmonary decline from negative sequelae of aspiration. MD/NSG updated. Precautions posted at bedside.     HPI HPI: Pt is a 86 y.o. 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 Has been admitted for sepsis, UTI in the past. Daughter says patient has had several months of slow decline but at baseline sits up in wheelchair, is conversant but frequently confused. Per report several days of altered mental status, eating and drinking less, not getting out of bed. Daughter is concerned about over[facility]medication at the [facility].      SLP Plan  Continue with current plan of care       Recommendations  Diet recommendations: Dysphagia 3 (mechanical soft);Nectar-thick liquid Liquids provided via: Cup;No straw Medication Administration: Whole meds with puree (as able but Crushed in Puree as needed) Supervision: Patient able to self feed;Staff to assist with self feeding;Intermittent supervision to cue for compensatory strategies Compensations: Minimize environmental distractions;Slow rate;Small sips/bites;Lingual sweep for clearance of pocketing;Follow solids with liquid Postural Changes and/or Swallow Maneuvers: Seated upright 90 degrees;Upright 30-60 min after meal  General recommendations:  (Dietician f/u; Palliative Care  f/u) Oral Care Recommendations: Oral care BID;Oral care before and after PO;Staff/trained caregiver to provide oral care Follow up Recommendations: Skilled Nursing facility SLP Visit Diagnosis: Dysphagia, oropharyngeal phase (R13.12) (baseline Dementia) Plan: Continue with current plan of care       GO                  Jerilynn Som, MS, CCC-SLP Speech Language Pathologist Rehab Services [phone] [facility] 10/19/2019, 2:31 PM

## 2019-10-19 NOTE — [place] FL2 (Signed)
[place] MEDICAID [id number] LEVEL OF CARE SCREENING TOOL     IDENTIFICATION  Patient Name: Jeremiah James Birthdate: [birth year] Sex: male Admission Date (Current Location): 10/17/2019  [place] and [place] Number:  Chiropodist and Address:  [facility], [address] [place], [place] [address]      Provider Number: [id number]  Attending Physician Name and Address:  Pennie Banter, DO  Relative Name and Phone Number:       Current Level of Care: Hospital Recommended Level of Care: Skilled Nursing Facility Prior Approval Number:    Date Approved/Denied:   PASRR Number:    Discharge Plan: SNF    Current Diagnoses: Patient Active Problem List   Diagnosis Date Noted  . Complicated UTI (urinary tract infection) 10/17/2019  . Severe sepsis (HCC) 10/19/2017  . Transient ischemic attack 12/03/2012  . S/P coronary artery stent placement 12/03/2012  . Atrial fibrillation (HCC) 06/14/2012  . 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 Other dyspnea and respiratory abnormality 06/14/2012  . Coronary artery disease involving native coronary artery of native heart without angina pectoris 05/05/2012  . Intermediate coronary syndrome (HCC) 05/05/2012  . Family history of ischemic heart disease (IHD) 11/26/2010    Orientation RESPIRATION BLADDER Height & Weight     Self  Normal External catheter, Incontinent (placed 9/22) Weight: 149 lb 11.1 oz (67.9 kg) Height:  5\' 8"  (172.7 cm)  BEHAVIORAL SYMPTOMS/MOOD NEUROLOGICAL BOWEL NUTRITION STATUS      Incontinent Diet (DYS 1 diet, nectar thick liquids)  AMBULATORY STATUS COMMUNICATION OF NEEDS Skin   Limited Assist Verbally Normal                       Personal Care Assistance Level of Assistance  Bathing, Feeding, Dressing Bathing Assistance: Limited assistance Feeding assistance: Independent Dressing Assistance: Limited assistance     Functional Limitations Info   Sight, Hearing, Speech Sight Info: Adequate Hearing Info: Adequate Speech Info: Adequate    SPECIAL CARE FACTORS FREQUENCY  PT (By licensed PT), OT (By licensed OT)     PT Frequency: 5x OT Frequency: 5x            Contractures Contractures Info: Not present    Additional Factors Info  Code Status, Allergies Code Status Info: DNR Allergies Info: Morphine, Hydrocodone-acetaminophen           Current Medications (10/19/2019):  This is the current hospital active medication list Current Facility-Administered Medications  Medication Dose Route Frequency Provider Last Rate Last Admin  . 0.9 %  sodium chloride infusion   Intravenous PRN 10/21/2019 A, DO      . acetaminophen (TYLENOL) suppository 650 mg  650 mg Rectal Q6H PRN Esaw Grandchild, MD   650 mg at 10/17/19 2331  . Chlorhexidine Gluconate Cloth 2 % PADS 6 each  6 each Topical Q0600 10/19/19, DO   6 each at 10/19/19 [phone]  . metoprolol succinate (TOPROL-XL) 24 hr tablet 50 mg  50 mg Oral Daily 2458, MD   50 mg at 10/19/19 0940  . mupirocin ointment (BACTROBAN) 2 % 1 application  1 application Nasal BID 10/21/19 A, DO   1 application at 10/19/19 [phone]  . tamsulosin (FLOMAX) capsule 0.4 mg  0.4 mg Oral QHS 0998, MD   0.4 mg at 10/18/19 2154  . Warfarin - Pharmacist Dosing Inpatient   Does not apply q1600 Wouk, 2155, MD  Given at 10/17/19 2058     Discharge Medications: Please see discharge summary for a list of discharge medications.  Relevant Imaging Results:  Relevant Lab Results:   Additional Information SSN:[id number]  Reuel Boom Harvir Patry, LCSW

## 2019-10-19 NOTE — Progress Notes (Signed)
PROGRESS NOTE    Jeremiah James   ZOX:[id number]  DOB: 02-27-1933  PCP: Lauro Regulus, MD    DOA: 10/17/2019 LOS: 2   Brief Narrative   Jeremiah James is a 86 y.o. male with medical history significant for dementia, a-fib on warfarin, TIA, CAD s/p DES, combined CHF (2020 TTE EF 35-40 w/ grade 3 DD), BPH, gout, HTN, who presented to the ED from [facility] with altered mental status.  Daughter provided history of several months of slowly declining cognition.  Baseline patient up in wheelchair, conversant but usually confused.  Patient had been eating and drinking less, not getting out of bed, and more confused than usual.    Evaluation in the ED showed leukocytosis with left shift, elevated creatinine 1.78.  CT abdomen/pelvis findings consistent with cystitis and urinanalysis.  Patient was febrile 100.6 F, in A-fib with RVR at 111 bpm.  Lactic acid normal.  Treated with IV fluids and broad spectrum antibiotics in the ED.  Admitted to hospitalist service for further management of severe sepsis secondary to UTI.     Assessment & Plan   Principal Problem:   Complicated UTI (urinary tract infection) Active Problems:   Atrial fibrillation (HCC)   Coronary artery disease involving native coronary artery of native heart without angina pectoris   Transient ischemic attack   Severe sepsis (HCC)   Severe sepsis secondary to Acute Complicated UTI - sepsis present on admission as evidenced by fever, tachycardia, leukocytosis, with UA and imaging suggestive on UTI.  Organ dysfunction as evidenced by AKI.  Normal lactic acid.   Treated per sepsis protocol in the ED with IV fluids and broad spectrum antibiotics.   --Subsequently on Zosyn until urine cultures returned with ESBL E.coli. --Will treat with fosfomycin --Tylenol PRN fevers --off IV fluids, consider gentle maintenance if inadequate PO intake  Acute Kidney Injury - present on admission with Cr 1.78 (vs 1.1-1.3 last year).  Likely pre-renal azotemia in setting of infection. Improving.  Cr 1.78 >> 1.35 >> 1.10 today.  Monitor BMP.  Avoid nephrotoxins and hypotension.  Renally dose meds as indicated.  Atrial fibrillation with RVR - presented in A-fib at 110 bpm, likely due to sepsis/infection.   --Continue home metoprolol --telemetry --warfarin per pharmacy  --monitor INR   Chronic combined systolic/diastolic CHF - not acutely decompensated on admission.  Prior echo with EF 35-40%, grade III diastolic dysfunction, mod MR.  Continue metoprolol.  Losartan and Lasix on hold due to AKI.  Coronary artery disease s/p strent - stable.  Not on ASA at home.  Essential hypertension - hold losartan due to AKI.  Monitor BP.  Maintain MAP>65.  BPH - continue Flomax. 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  Outpatient f/u, given advanced dementia likely reasonable to defer  Dementia - so far no behavioral disturbances.  Hold home olanzapine and depakote until safe taking PO. --SLP consult for swallow evaluation   DVT prophylaxis: on warfarin   Diet:  Diet Orders (From admission, onward)    Start     Ordered   10/18/19 1502  DIET - DYS 1 Room service appropriate? Yes with Assist; Fluid consistency: Nectar Thick  Diet effective now       Comments: Extra Gravy on meats, potatoes. May have Oatmeal per Speech. Yogurt, pudding tid meals.  Question Answer Comment  Room service appropriate? Yes with Assist   Fluid consistency: Nectar Thick  10/18/19 1502            Code Status: DNR    Subjective 10/19/19    Patient seen this AM with daughter at bedside.  No acute events reported overnight.  Patient is awake this AM, knows who his daughter is and carrying on conversation with her.  He denies feeling feverish, abdominal pain, nausea, vomiting or other complaints.   Disposition Plan & Communication   Status is:  Inpatient  Remains inpatient appropriate because:IV treatments appropriate due to intensity of illness or inability to take PO   Dispo: The patient is from: SNF              Anticipated d/c is to: SNF              Anticipated d/c date is: 1 day              Patient currently is not medically stable to d/c.   Family Communication: none at bedside will attempt to call.    Consults, Procedures, Significant Events   Consultants:   None  Procedures:   None  Antimicrobials:  Anti-infectives (From admission, onward)   Start     Dose/Rate Route Frequency Ordered Stop   10/18/19 1800  cefTRIAXone (ROCEPHIN) 1 g in sodium chloride 0.9 % 100 mL IVPB        1 g 200 mL/hr over 30 Minutes Intravenous Every 24 hours 10/18/19 1157     10/17/19 2000  piperacillin-tazobactam (ZOSYN) IVPB 3.375 g  Status:  Discontinued        3.375 g 12.5 mL/hr over 240 Minutes Intravenous Every 8 hours 10/17/19 1634 10/18/19 1157   10/17/19 1215  ceFEPIme (MAXIPIME) 2 g in sodium chloride 0.9 % 100 mL IVPB        2 g 200 mL/hr over 30 Minutes Intravenous  Once 10/17/19 1206 10/17/19 1300   10/17/19 1215  metroNIDAZOLE (FLAGYL) IVPB 500 mg        500 mg 100 mL/hr over 60 Minutes Intravenous  Once 10/17/19 1206 10/17/19 1500   10/17/19 1215  vancomycin (VANCOCIN) IVPB 1000 mg/200 mL premix        1,000 mg 200 mL/hr over 60 Minutes Intravenous  Once 10/17/19 1206 10/17/19 1655        Objective   Vitals:   10/18/19 1658 10/18/19 1915 10/19/19 0342 10/19/19 0345  BP: 116/63 122/66 114/62   Pulse: (!) 101 94 92   Resp: 19  (!) 27   Temp: 98.2 F (36.8 C) 97.9 F (36.6 C) 97.8 F (36.6 C)   TempSrc: Axillary Oral Oral   SpO2: 97% 96% 93%   Weight:    67.9 kg  Height:        Intake/Output Summary (Last 24 hours) at 10/19/2019 0735 Last data filed at 10/19/2019 0439 Gross per 24 hour  Intake 170.09 ml  Output 550 ml  Net -379.91 ml   Filed Weights   10/17/19 1033 10/18/19 0304 10/19/19 0345   Weight: 84.1 kg 67.1 kg 67.9 kg    Physical Exam:  General exam: awake, alert, no acute distress Respiratory system: CTAB, no wheezes, rales or rhonchi, normal respiratory effort. Cardiovascular system: normal S1/S2, RRR, no pedal edema.   Gastrointestinal system: soft, NT, ND, no HSM felt, +bowel sounds. Extremities: moves all, no cyanosis, normal tone Skin: dry, intact, normal temperature, normal color   Labs   Data Reviewed: I have personally reviewed following labs and imaging studies  CBC: Recent Labs  Lab 10/17/19 1057 10/18/19 0510 10/19/19 0404  WBC 14.7* 14.5* 11.2*  NEUTROABS 12.2*  --   --   HGB 13.5 11.0* 10.6*  HCT 39.3 33.0* 31.3*  MCV 92.5 96.2 93.4  PLT 249 190 222   Basic Metabolic Panel: Recent Labs  Lab 10/17/19 1057 10/18/19 0510 10/19/19 0404  NA 140 140 142  K 4.1 4.9 3.6  CL 101 104 106  CO2 GLUCOSE 127* 114* 100*  BUN 31* 29* 27*  CREATININE 1.78* 1.35* 1.10  CALCIUM 9.0 8.7* 8.6*  MG  --   --  2.1   GFR: Estimated Creatinine Clearance: 48 mL/min (by C-G formula based on SCr of 1.1 mg/dL). Liver Function Tests: Recent Labs  Lab 10/17/19 1057 10/18/19 0510  AST 32 32  ALT 16 13  ALKPHOS 58 42  BILITOT 0.8 1.1  PROT 8.3* 6.6  ALBUMIN 3.6 2.8*   No results for input(s): LIPASE, AMYLASE in the last 168 hours. Recent Labs  Lab 10/18/19 0942  AMMONIA 12   Coagulation Profile: Recent Labs  Lab 10/17/19 1057 10/18/19 0510 10/19/19 0404  INR 2.4* 2.6* 3.1*   Cardiac Enzymes: No results for input(s): CKTOTAL, CKMB, CKMBINDEX, TROPONINI in the last 168 hours. BNP (last 3 results) No results for input(s): PROBNP in the last 8760 hours. HbA1C: No results for input(s): HGBA1C in the last 72 hours. CBG: No results for input(s): GLUCAP in the last 168 hours. Lipid Profile: No results for input(s): CHOL, HDL, LDLCALC, TRIG, CHOLHDL, LDLDIRECT in the last 72 hours. Thyroid Function Tests: No results for input(s):  TSH, T4TOTAL, FREET4, T3FREE, THYROIDAB in the last 72 hours. Anemia Panel: No results for input(s): VITAMINB12, FOLATE, FERRITIN, TIBC, IRON, RETICCTPCT in the last 72 hours. Sepsis Labs: Recent Labs  Lab 10/17/19 1057 10/18/19 0510  LATICACIDVEN 1.9 1.4    Recent Results (from the past 240 hour(s))  Blood culture (routine x 2)     Status: None (Preliminary result)   Collection Time: 10/17/19 10:57 AM   Specimen: BLOOD  Result Value Ref Range Status   Specimen Description BLOOD LEFT FA  Final   Special Requests   Final    BOTTLES DRAWN AEROBIC AND ANAEROBIC Blood Culture results may not be optimal due to an excessive volume of blood received in culture bottles   Culture   Final    NO GROWTH 2 DAYS Performed at [facility], [address] [place], [place] [address]    Report Status PENDING  Incomplete  Blood culture (routine x 2)     Status: None (Preliminary result)   Collection Time: 10/17/19 10:57 AM   Specimen: BLOOD  Result Value Ref Range Status   Specimen Description BLOOD RIGHT [facility]  Final   Special Requests   Final    BOTTLES DRAWN AEROBIC AND ANAEROBIC Blood Culture adequate volume   Culture   Final    NO GROWTH 2 DAYS Performed at [facility], [address] [place], [place] [address]    Report Status PENDING  Incomplete  Urine culture     Status: Abnormal (Preliminary result)   Collection Time: 10/17/19 11:10 AM   Specimen: In/Out Cath Urine  Result Value Ref Range Status   Specimen Description   Final    IN/OUT CATH URINE Performed at [facility], [address], [place], [place] [address]    Special Requests   Final    NONE Performed at [facility], [address] [place], [place] [address]    Culture (A)  Final    >=100,000 COLONIES/mL GRAM NEGATIVE RODS IDENTIFICATION AND SUSCEPTIBILITIES TO FOLLOW Performed at [facility], [address], [place], [place] [address]    Report Status PENDING   Incomplete  SARS Coronavirus 2 by RT PCR (hospital order, performed in [facility] hospital lab) Nasopharyngeal Nasopharyngeal Swab     Status: None   Collection Time: 10/17/19  4:09 PM   Specimen: Nasopharyngeal Swab  Result Value Ref Range Status   SARS Coronavirus 2 NEGATIVE NEGATIVE Final    Comment: (NOTE) SARS-CoV-2 target nucleic acids are NOT DETECTED.  The SARS-CoV-2 RNA is generally detectable in upper and lower respiratory specimens during the acute phase of infection. The lowest concentration of SARS-CoV-2 viral copies this assay can detect is 250 copies / mL. A negative result does not preclude SARS-CoV-2 infection and should not be used as the sole basis for treatment or other patient management decisions.  A negative result may occur with improper specimen collection / handling, submission of specimen other than nasopharyngeal swab, presence of viral mutation(s) within the areas targeted by this assay, and inadequate number of viral copies (<250 copies / mL). A negative result must be combined with clinical observations, patient history, and epidemiological information.  Fact Sheet for Patients:   BoilerBrush.com.cy  Fact Sheet for Healthcare Providers: https://pope.com/  This test is not yet approved or  cleared by the [place] FDA and has been authorized for detection and/or diagnosis of SARS-CoV-2 by FDA under an Emergency Use Authorization (EUA).  This EUA will remain in effect (meaning this test can be used) for the duration of the COVID-19 declaration under Section 564(b)(1) of the Act, 21 U.S.C. section 360bbb-3(b)(1), unless the authorization is terminated or revoked sooner.  Performed at [facility], [address] [place], [place] [address]   MRSA PCR Screening     Status: Abnormal   Collection Time: 10/18/19  7:54 PM   Specimen: Nasal Mucosa; Nasopharyngeal  Result Value Ref Range Status    MRSA by PCR POSITIVE (A) NEGATIVE Final    Comment:        The GeneXpert MRSA Assay (FDA approved for NASAL specimens only), is one component of a comprehensive MRSA colonization surveillance program. It is not intended to diagnose MRSA infection nor to guide or monitor treatment for MRSA infections. RESULT CALLED TO, READ BACK BY AND VERIFIED WITH: MICHELE ROGERS 10/18/19 AT 2149 BY ACR Performed at [facility], [address] [place], [place] [address]       Imaging Studies   CT ABDOMEN PELVIS WO CONTRAST  Result Date: 10/17/2019 CLINICAL DATA:  Altered mental status.  Poor oral intake EXAM: CT ABDOMEN AND PELVIS WITHOUT CONTRAST TECHNIQUE: Multidetector CT imaging of the abdomen and pelvis was performed following the standard protocol without IV contrast. COMPARISON:  10/19/2017 FINDINGS: Lower chest: Bibasilar scarring or atelectasis. Cardiomegaly. Trace pericardial effusion. Coronary artery calcification. Hepatobiliary: Multiple probable cysts the liver appear unchanged the prior study. No new focal hepatic lesion is identified on noncontrast study. Status post cholecystectomy. No biliary dilatation. Pancreas: Along the anterior margin of the pancreatic head/neck is a ovoid mass measuring 2.3 x 2.0 x 2.1 cm measuring fluid density (series 3, image 23; series 7, image 59). No pancreatic ductal dilatation. No peripancreatic inflammatory changes. Spleen: Normal in size without focal abnormality. Adrenals/Urinary Tract: Adrenal glands within normal limits. 1.5 cm right renal cyst. Vascular calcifications within the bilateral renal  hila. No definite renal stone. No hydronephrosis. Bilateral ureters are unremarkable. Mild circumferential thickening of the urinary bladder wall, which may be accentuated by underdistention. Stomach/Bowel: Stomach is within normal limits. Appendix appears normal. Sigmoid diverticulosis. No evidence of bowel wall thickening, distention, or inflammatory  changes. Mildly prominent rectal stool ball. Vascular/Lymphatic: Aortic atherosclerosis. No enlarged abdominal or pelvic lymph nodes. Reproductive: Prostate gland appears within normal limits. Other: No free fluid. No abdominopelvic fluid collection. No pneumoperitoneum. No abdominal wall hernia. Musculoskeletal: L1 superior endplate Schmorl's node with associated age indeterminate mild compression fracture (series 7, image 66), new from prior. No bony retropulsion. Advanced degenerative disc disease and facet arthropathy throughout the lumbar spine. IMPRESSION: 1. Mild circumferential thickening of the urinary bladder wall, which may be accentuated by underdistention. Correlate with urinalysis to exclude cystitis. 2. Sigmoid diverticulosis without evidence of acute diverticulitis. 3. L1 superior endplate Schmorl's node with associated age-indeterminate mild compression fracture, new from prior. No bony retropulsion. Correlate with point tenderness. 4. Along the anterior margin of the pancreatic head/neck is a 2.3 cm ovoid mass measuring fluid density. Findings may represent a cystic pancreatic neoplasm or possibly a pseudocyst. A nonemergent MRI/MRCP or contrast enhanced CT of the abdomen is suggested for baseline to guide/determine future follow-up and management. 5. Cardiomegaly with trace pericardial effusion. 6. Aortic atherosclerosis. (ICD10-I70.0). Electronically Signed   By: Duanne Guess D.O.   On: 10/17/2019 15:37   CT Head Wo Contrast  Result Date: 10/17/2019 CLINICAL DATA:  Mental status change. EXAM: CT HEAD WITHOUT CONTRAST TECHNIQUE: Contiguous axial images were obtained from the base of the skull through the vertex without intravenous contrast. COMPARISON:  11/14/2017 FINDINGS: Brain: No evidence of acute infarction, hemorrhage, hydrocephalus, extra-axial collection or mass lesion/mass effect. Advanced atrophy and chronic microvascular ischemic changes are again noted. These appear to have  progressed since 2019. Bilateral basal ganglia lacunar infarcts are again noted. Vascular: No hyperdense vessel or unexpected calcification. Skull: Normal. Negative for fracture or focal lesion. Sinuses/Orbits: No acute finding. Other: None. IMPRESSION: 1. No acute intracranial abnormality. 2. Advanced atrophy and chronic microvascular ischemic changes are again noted. These appear to have progressed since 2019. Electronically Signed   By: Katherine Mantle M.D.   On: 10/17/2019 15:20   DG Chest Port 1 View  Result Date: 10/17/2019 CLINICAL DATA:  Altered mental status EXAM: PORTABLE CHEST 1 VIEW COMPARISON:  November 14, 2017 FINDINGS: The cardiomediastinal silhouette is unchanged and enlarged in contour.Atherosclerotic calcifications of the aorta. No pleural effusion. No pneumothorax. No acute pleuroparenchymal abnormality. Unchanged bibasilar atelectasis. Visualized abdomen is unremarkable. Multilevel degenerative changes of the thoracic spine. IMPRESSION: No acute cardiopulmonary abnormality. Electronically Signed   By: Meda Klinefelter MD   On: 10/17/2019 11:08     Medications   Scheduled Meds: . Chlorhexidine Gluconate Cloth  6 each Topical Q0600  . metoprolol succinate  50 mg Oral Daily  . mupirocin ointment  1 application Nasal BID  . tamsulosin  0.4 mg Oral QHS  . Warfarin - Pharmacist Dosing Inpatient   Does not apply q1600   Continuous Infusions: . cefTRIAXone (ROCEPHIN)  IV 200 mL/hr at 10/18/19 1743       LOS: 2 days    Time spent: 30 minutes with > 50% spent in coordination of care and direct patient contact.    Pennie Banter, DO [facility]  10/19/2019, 7:35 AM    If 7PM-7AM, please contact night-coverage. How to contact the [facility] Attending or Consulting provider 7A - 7P or  covering provider during after hours 7P -7A, for this patient?    1. Check the care team in [facility] and look for a) attending/consulting [facility] provider listed and b) the [facility] team  listed 2. Log into [email or web address] and use [facility] universal password to access. If you do not have the password, please contact the hospital operator. 3. Locate the [facility] provider you are looking for under [facility] and page to a number that you can be directly reached. 4. If you still have difficulty reaching the provider, please page the [facility] (Director on Call) for the Hospitalists listed on amion for assistance.

## 2019-10-19 NOTE — Progress Notes (Signed)
ANTICOAGULATION CONSULT NOTE - Initial Consult  Pharmacy Consult for Warfarin Indication: atrial fibrillation  Allergies  Allergen Reactions  . Morphine Rash    unknown  . Hydrocodone-Acetaminophen Rash    agitation    Patient Measurements: Height: 5\' 8"  (172.7 cm) Weight: 67.9 kg (149 lb 11.1 oz) IBW/kg (Calculated) : 68.4 Heparin Dosing Weight:    Vital Signs: Temp: 97.8 F (36.6 C) (09/23 0342) Temp Source: Oral (09/23 0342) BP: 114/62 (09/23 0342) Pulse Rate: 92 (09/23 0342)  Labs: Recent Labs    10/17/19 1057 10/17/19 1057 10/18/19 0510 10/19/19 0404  HGB 13.5   < > 11.0* 10.6*  HCT 39.3  --  33.0* 31.3*  PLT 249  --  190 222  APTT 49*  --   --   --   LABPROT 25.1*  --  27.2* 30.6*  INR 2.4*  --  2.6* 3.1*  CREATININE 1.78*  --  1.35* 1.10   < > = values in this interval not displayed.    Estimated Creatinine Clearance: 48 mL/min (by C-G formula based on SCr of 1.1 mg/dL).   Medical History: Past Medical History:  Diagnosis Date  . A-fib (HCC)   . MI (myocardial infarction) (HCC)    x6    Medications:  Scheduled:  . Chlorhexidine Gluconate Cloth  6 each Topical Q0600  . metoprolol succinate  50 mg Oral Daily  . mupirocin ointment  1 application Nasal BID  . tamsulosin  0.4 mg Oral QHS  . Warfarin - Pharmacist Dosing Inpatient   Does not apply q1600   Infusions:  . cefTRIAXone (ROCEPHIN)  IV 200 mL/hr at 10/18/19 1743    Home dose: Warfarin 2.5 mg po at bedtime  Assessment: 86 yo M admitted for AMS with PMH significant for dementia, TIA, CAD s/p DES, combined CHF, and Afib on Warfarin. Patient noted to have blood on tip of penis with small clots present and hematuria. Patient has foley/in-out cath at this time. Pharmacy has been consulted for warfarin dosing and monitoring.   Hgb 13.5>11; Plt 249>190  Date INR Dose 9/21 2.4  2.5mg  9/22 2.6 Held 9/23 3.1    Goal of Therapy:  INR 2-3 Monitor platelets by anticoagulation protocol:  Yes   Plan:  INR is supratherapeutic and still trending up; likely in the setting of infection and DDI with antibiotics (Zosyn, CTX). Will continue to hold warfarin tonight. Monitor INR and CBC with AM labs   10/23, PharmD Pharmacy Resident  10/19/2019 6:56 AM

## 2019-10-19 NOTE — Progress Notes (Signed)
Patient had 5 beats run of vtach this am. Asymptomatic and MD aware. Patient awake and eating breakfast this morning. Very pleasant and took PO med with applesauce.

## 2019-10-20 LAB — CBC
HCT: 30.4 % — ABNORMAL LOW (ref 39.0–52.0)
Hemoglobin: 9.8 g/dL — ABNORMAL LOW (ref 13.0–17.0)
MCH: 31.5 pg (ref 26.0–34.0)
MCHC: 32.2 g/dL (ref 30.0–36.0)
MCV: 97.7 fL (ref 80.0–100.0)
Platelets: 240 10*3/uL (ref 150–400)
RBC: 3.11 MIL/uL — ABNORMAL LOW (ref 4.22–5.81)
RDW: 13.5 % (ref 11.5–15.5)
WBC: 9.5 10*3/uL (ref 4.0–10.5)
nRBC: 0 % (ref 0.0–0.2)

## 2019-10-20 LAB — BASIC METABOLIC PANEL
Anion gap: 9 (ref 5–15)
BUN: 30 mg/dL — ABNORMAL HIGH (ref 8–23)
CO2: 26 mmol/L (ref 22–32)
Calcium: 8.6 mg/dL — ABNORMAL LOW (ref 8.9–10.3)
Chloride: 111 mmol/L (ref 98–111)
Creatinine, Ser: 1.09 mg/dL (ref 0.61–1.24)
GFR calc Af Amer: >60 mL/min (ref >60)
GFR calc non Af Amer: >60 mL/min (ref >60)
Glucose, Bld: 114 mg/dL — ABNORMAL HIGH (ref 70–99)
Potassium: 4 mmol/L (ref 3.5–5.1)
Sodium: 146 mmol/L — ABNORMAL HIGH (ref 135–145)

## 2019-10-20 LAB — PROTIME-INR
INR: 2.6 — ABNORMAL HIGH (ref 0.8–1.2)
Prothrombin Time: 26.8 seconds — ABNORMAL HIGH (ref 11.4–15.2)

## 2019-10-20 MED ORDER — WARFARIN SODIUM 2.5 MG PO TABS
2.5000 mg | ORAL_TABLET | Freq: Once | ORAL | Status: DC
  Filled 2019-10-20: qty 1

## 2019-10-20 NOTE — Progress Notes (Signed)
IV and tele removed/report called to [facility]. EMS to transport.

## 2019-10-20 NOTE — Care Management Important Message (Signed)
Important Message  Patient Details  Name: Ramir Malerba MRN: [id number] Date of Birth: 01-01-1934   Medicare Important Message Given:  No  Patient discharged prior to attempt to review concurrent Medicare IM.    Johnell Comings 10/20/2019, 3:34 PM

## 2019-10-20 NOTE — Discharge Summary (Addendum)
Physician Discharge Summary  [name] BZJ:[id number] DOB: Jul 21, 1933 DOA: 10/17/2019  PCP: Lauro Regulus, MD  Admit date: 10/17/2019 Discharge date: 10/20/2019  Admitted From: SNF Disposition:  SNF  Recommendations for Outpatient Follow-up:  Follow up with PCP in 1-2 weeks Please obtain BMP/CBC in one week Please consider reducing or discontinuing Depakote.  Family express concern about patient being overly sedated, too sleepy to wake up to eat and drink since this medication was started.  Depakote was held during admission, patient was more alert and conversational when family visited once he improved from infection. Please follow up on patient's blood pressure.  BP's during admission were soft with Lasix and losartan held.  Lasix resumed on discharge given his reduced EF, but losartan was held at time of discharge.  Please reassess and resume losartan if BP will tolerate. Monitor INR and adjust warfarin as needed Follow up on incidental finding of a pancreatic lesion seen on CT at time of admission    Home Health: No  Equipment/Devices: None   Discharge Condition: Stable  CODE STATUS: DNR  Diet recommendation: Dysphagia level 3, nectar thickened liquids.  No straws.  Discharge Diagnoses: Principal Problem:   Complicated UTI (urinary tract infection) Active Problems:   Atrial fibrillation (HCC)   Coronary artery disease involving native coronary artery of native heart without angina pectoris   Transient ischemic attack   Severe sepsis [facility])    Summary of HPI and Hospital Course:  Jeremiah James is a 86 y.o. male with medical history significant for dementia, a-fib on warfarin, TIA, CAD s/p DES, combined CHF (2020 TTE EF 35-40 w/ grade 3 DD), BPH, gout, HTN, who presented to the ED from [facility] with altered mental status.  Daughter provided history of several months of slowly declining cognition.  Baseline patient up in wheelchair, conversant but usually confused.   Patient had been eating and drinking less, not getting out of bed, and more confused than usual.    Evaluation in the ED showed leukocytosis with left shift, elevated creatinine 1.78.  CT abdomen/pelvis findings consistent with cystitis and urinanalysis.  Patient was febrile 100.6 F, in A-fib with RVR at 111 bpm.  Lactic acid normal.  Treated with IV fluids and broad spectrum antibiotics in the ED.  Admitted to hospitalist service for further management of severe sepsis secondary to UTI.     Severe sepsis secondary to Acute Complicated UTI - sepsis present on admission as evidenced by fever, tachycardia, leukocytosis, with UA and imaging suggestive on UTI.  Organ dysfunction as evidenced by AKI.  Normal lactic acid.   Treated per sepsis protocol in the ED with IV fluids and broad spectrum antibiotics.   Sepsis physiology resolved. --Initially treated with empiric Zosyn  --Urine culture grew ESBL E.coli. - treated with fosfomycin Patient clinically improved to his baseline and asymptomatic.  Stable for discharge to SNF today.    Acute Kidney Injury - Resolved.  Present on admission with Cr 1.78 (vs 1.1-1.3 last year). Likely pre-renal azotemia in setting of infection.  Cr trend: 1.78 >> 1.35 >> 1.10>>1.09 today.      Permanent Atrial fibrillation / Afib with RVR - presented in A-fib at 110 bpm, likely due to sepsis/infection.  Rate now controlled. --Continue metoprolol and warfarin --monitor INR     Chronic combined systolic/diastolic CHF - not acutely decompensated on admission.   Prior echo with EF 35-40%, grade III diastolic dysfunction, mod MR.   --Continue metoprolol and Lasix --continue to hold losartan at  discharge due to soft BP's    Coronary artery disease s/p strent - stable.  Not on ASA at home.    Essential hypertension - hold losartan due to soft BP's.  Monitor BP.  On metoprolol.   Iron Deficiency Anemia - relatively stable.  Pt had some hematuria on initial UA, so  warfarin was held.  Hbg 13 on admission, possibly was hemoconcentrated.  Baseline Hbg appears 10-11 range, today is 9.8.  Recheck CBC in 1 week.  Continue iron supplement.    BPH - continue Flomax.   Acute urinary retention - Resolved.  Monitored with bladder scans, in/out cath if retaining > 400 cc.  Consider urology referral if this is a recurrent problem.    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   Recommend outpatient f/u, but given advanced dementia likely reasonable to defer if no plan for any intervention.    Dementia - no behavioral disturbances during admission. Continue olanzapine and depakote   Dysphagia - evaluated by SLP.  Dyphagia 3 diet, nectar liquids recommended.    Acute metabolic encephalopathy - due to infection, present on admission.  Resolved.   Discharge Instructions   Discharge Instructions     (HEART FAILURE PATIENTS) Call MD:  Anytime you have any of the following symptoms: 1) 3 pound weight gain in 24 hours or 5 pounds in 1 week 2) shortness of breath, with or without a dry hacking cough 3) swelling in the hands, feet or stomach 4) if you have to sleep on extra pillows at night in order to breathe.   Complete by: As directed    Call MD for:  extreme fatigue   Complete by: As directed    Call MD for:  persistant dizziness or light-headedness   Complete by: As directed    Call MD for:  persistant nausea and vomiting   Complete by: As directed    Call MD for:  severe uncontrolled pain   Complete by: As directed    Call MD for:  temperature >100.4   Complete by: As directed    Diet - low sodium heart healthy   Complete by: As directed    Increase activity slowly   Complete by: As directed       Allergies as of 10/20/2019       Reactions   Morphine Rash   unknown   Hydrocodone-acetaminophen Rash   agitation        Medication List     STOP taking these medications    losartan 25 MG tablet Commonly known as: COZAAR        TAKE these medications    acetaminophen 500 MG tablet Commonly known as: TYLENOL Take 1,000 mg by mouth in the morning and at bedtime.   allopurinol 100 MG tablet Commonly known as: ZYLOPRIM Take 100 mg by mouth daily.   divalproex 125 MG capsule Commonly known as: DEPAKOTE SPRINKLE Take 125 mg by mouth 3 (three) times daily.   ferrous sulfate 325 (65 FE) MG tablet Take 325 mg by mouth daily with breakfast.   furosemide 20 MG tablet Commonly known as: LASIX Take 20 mg by mouth daily.   metoprolol succinate 50 MG 24 hr tablet Commonly known as: TOPROL-XL Take 50 mg by mouth daily.   nitroGLYCERIN 0.4 MG SL tablet Commonly known as: NITROSTAT Place 0.4 mg under the tongue every 5 (five) minutes as needed for chest pain.   OLANZapine 2.5 MG tablet Commonly known as: ZYPREXA Take 2.5  mg by mouth at bedtime.   pantoprazole 40 MG tablet Commonly known as: PROTONIX Take 40 mg by mouth daily.   tamsulosin 0.4 MG Caps capsule Commonly known as: FLOMAX Take 0.4 mg by mouth at bedtime.   warfarin 2.5 MG tablet Commonly known as: COUMADIN Take 1 tablet (2.5 mg total) by mouth at bedtime.         Allergies  Allergen Reactions   Morphine Rash    unknown   Hydrocodone-Acetaminophen Rash    agitation    Consultations: None    Procedures/Studies: CT ABDOMEN PELVIS WO CONTRAST  Result Date: 10/17/2019 CLINICAL DATA:  Altered mental status.  Poor oral intake EXAM: CT ABDOMEN AND PELVIS WITHOUT CONTRAST TECHNIQUE: Multidetector CT imaging of the abdomen and pelvis was performed following the standard protocol without IV contrast. COMPARISON:  10/19/2017 FINDINGS: Lower chest: Bibasilar scarring or atelectasis. Cardiomegaly. Trace pericardial effusion. Coronary artery calcification. Hepatobiliary: Multiple probable cysts the liver appear unchanged the prior study. No new focal hepatic lesion is identified on noncontrast study. Status post cholecystectomy. No biliary  dilatation. Pancreas: Along the anterior margin of the pancreatic head/neck is a ovoid mass measuring 2.3 x 2.0 x 2.1 cm measuring fluid density (series 3, image 23; series 7, image 59). No pancreatic ductal dilatation. No peripancreatic inflammatory changes. Spleen: Normal in size without focal abnormality. Adrenals/Urinary Tract: Adrenal glands within normal limits. 1.5 cm right renal cyst. Vascular calcifications within the bilateral renal hila. No definite renal stone. No hydronephrosis. Bilateral ureters are unremarkable. Mild circumferential thickening of the urinary bladder wall, which may be accentuated by underdistention. Stomach/Bowel: Stomach is within normal limits. Appendix appears normal. Sigmoid diverticulosis. No evidence of bowel wall thickening, distention, or inflammatory changes. Mildly prominent rectal stool ball. Vascular/Lymphatic: Aortic atherosclerosis. No enlarged abdominal or pelvic lymph nodes. Reproductive: Prostate gland appears within normal limits. Other: No free fluid. No abdominopelvic fluid collection. No pneumoperitoneum. No abdominal wall hernia. Musculoskeletal: L1 superior endplate Schmorl's node with associated age indeterminate mild compression fracture (series 7, image 29), new from prior. No bony retropulsion. Advanced degenerative disc disease and facet arthropathy throughout the lumbar spine. IMPRESSION: 1. Mild circumferential thickening of the urinary bladder wall, which may be accentuated by underdistention. Correlate with urinalysis to exclude cystitis. 2. Sigmoid diverticulosis without evidence of acute diverticulitis. 3. L1 superior endplate Schmorl's node with associated age-indeterminate mild compression fracture, new from prior. No bony retropulsion. Correlate with point tenderness. 4. Along the anterior margin of the pancreatic head/neck is a 2.3 cm ovoid mass measuring fluid density. Findings may represent a cystic pancreatic neoplasm or possibly a pseudocyst.  A nonemergent MRI/MRCP or contrast enhanced CT of the abdomen is suggested for baseline to guide/determine future follow-up and management. 5. Cardiomegaly with trace pericardial effusion. 6. Aortic atherosclerosis. (ICD10-I70.0). Electronically Signed   By: Duanne Guess D.O.   On: 10/17/2019 15:37   CT Head Wo Contrast  Result Date: 10/17/2019 CLINICAL DATA:  Mental status change. EXAM: CT HEAD WITHOUT CONTRAST TECHNIQUE: Contiguous axial images were obtained from the base of the skull through the vertex without intravenous contrast. COMPARISON:  11/14/2017 FINDINGS: Brain: No evidence of acute infarction, hemorrhage, hydrocephalus, extra-axial collection or mass lesion/mass effect. Advanced atrophy and chronic microvascular ischemic changes are again noted. These appear to have progressed since 2019. Bilateral basal ganglia lacunar infarcts are again noted. Vascular: No hyperdense vessel or unexpected calcification. Skull: Normal. Negative for fracture or focal lesion. Sinuses/Orbits: No acute finding. Other: None. IMPRESSION: 1. No acute intracranial abnormality.  2. Advanced atrophy and chronic microvascular ischemic changes are again noted. These appear to have progressed since 2019. Electronically Signed   By: Katherine Mantle M.D.   On: 10/17/2019 15:20   DG Chest Port 1 View  Result Date: 10/17/2019 CLINICAL DATA:  Altered mental status EXAM: PORTABLE CHEST 1 VIEW COMPARISON:  November 14, 2017 FINDINGS: The cardiomediastinal silhouette is unchanged and enlarged in contour.Atherosclerotic calcifications of the aorta. No pleural effusion. No pneumothorax. No acute pleuroparenchymal abnormality. Unchanged bibasilar atelectasis. Visualized abdomen is unremarkable. Multilevel degenerative changes of the thoracic spine. IMPRESSION: No acute cardiopulmonary abnormality. Electronically Signed   By: Meda Klinefelter MD   On: 10/17/2019 11:08      Subjective: Patient seen this AM, sleeping but woke  easily to voice.  He denies pain, fever/chills or other complaints.  No acute events reported overnight.    Discharge Exam: Vitals:   10/20/19 0725 10/20/19 1059  BP: 132/70 (!) 102/56  Pulse: 89 73  Resp: 16 16  Temp: 98.7 F (37.1 C) 98.6 F (37 C)  SpO2: 98% 97%   Vitals:   10/19/19 1932 10/20/19 0429 10/20/19 0725 10/20/19 1059  BP: (!) 126/53 140/74 132/70 (!) 102/56  Pulse: 100 83 89 73  Resp: Temp: (!) 97.5 F (36.4 C) 98.4 F (36.9 C) 98.7 F (37.1 C) 98.6 F (37 C)  TempSrc: Oral Oral Oral Oral  SpO2: 98% 97% 98% 97%  Weight:  67.9 kg    Height:        General: Pt is alert, awake, not in acute distress Cardiovascular: RRR, S1/S2 +, no rubs, no gallops Respiratory: CTA bilaterally, no wheezing, no rhonchi Abdominal: Soft, NT, ND, bowel sounds + Extremities: no edema, no cyanosis    The results of significant diagnostics from this hospitalization (including imaging, microbiology, ancillary and laboratory) are listed below for reference.     Microbiology: Recent Results (from the past 240 hour(s))  Blood culture (routine x 2)     Status: None (Preliminary result)   Collection Time: 10/17/19 10:57 AM   Specimen: BLOOD  Result Value Ref Range Status   Specimen Description BLOOD LEFT FA  Final   Special Requests   Final    BOTTLES DRAWN AEROBIC AND ANAEROBIC Blood Culture results may not be optimal due to an excessive volume of blood received in culture bottles   Culture   Final    NO GROWTH 3 DAYS Performed at [facility], [address] [place], [place] [address]    Report Status PENDING  Incomplete  Blood culture (routine x 2)     Status: None (Preliminary result)   Collection Time: 10/17/19 10:57 AM   Specimen: BLOOD  Result Value Ref Range Status   Specimen Description BLOOD RIGHT [facility]  Final   Special Requests   Final    BOTTLES DRAWN AEROBIC AND ANAEROBIC Blood Culture adequate volume   Culture   Final    NO GROWTH 3  DAYS Performed at [facility], [address], [place], [place] [address]    Report Status PENDING  Incomplete  Urine culture     Status: Abnormal   Collection Time: 10/17/19 11:10 AM   Specimen: In/Out Cath Urine  Result Value Ref Range Status   Specimen Description   Final    IN/OUT CATH URINE Performed at [facility], [address], [place], [place] [address]    Special Requests   Final    NONE Performed  at [facility], [address] [place], [place] [address]    Culture (A)  Final    >=100,000 COLONIES/mL ESCHERICHIA COLI Confirmed Extended Spectrum Beta-Lactamase Producer (ESBL).  In bloodstream infections from ESBL organisms, carbapenems are preferred over piperacillin/tazobactam. They are shown to have a lower risk of mortality.    Report Status 10/19/2019 FINAL  Final   Organism ID, Bacteria ESCHERICHIA COLI (A)  Final      Susceptibility   Escherichia coli - MIC*    AMPICILLIN >=32 RESISTANT Resistant     CEFAZOLIN >=64 RESISTANT Resistant     CEFTRIAXONE >=64 RESISTANT Resistant     CIPROFLOXACIN >=4 RESISTANT Resistant     GENTAMICIN >=16 RESISTANT Resistant     IMIPENEM <=0.25 SENSITIVE Sensitive     NITROFURANTOIN <=16 SENSITIVE Sensitive     TRIMETH/SULFA >=320 RESISTANT Resistant     AMPICILLIN/SULBACTAM >=32 RESISTANT Resistant     PIP/TAZO 8 SENSITIVE Sensitive     * >=100,000 COLONIES/mL ESCHERICHIA COLI  SARS Coronavirus 2 by RT PCR (hospital order, performed in [facility] hospital lab) Nasopharyngeal Nasopharyngeal Swab     Status: None   Collection Time: 10/17/19  4:09 PM   Specimen: Nasopharyngeal Swab  Result Value Ref Range Status   SARS Coronavirus 2 NEGATIVE NEGATIVE Final    Comment: (NOTE) SARS-CoV-2 target nucleic acids are NOT DETECTED.  The SARS-CoV-2 RNA is generally detectable in upper and lower respiratory specimens during the acute phase of infection. The lowest concentration of SARS-CoV-2 viral  copies this assay can detect is 250 copies / mL. A negative result does not preclude SARS-CoV-2 infection and should not be used as the sole basis for treatment or other patient management decisions.  A negative result may occur with improper specimen collection / handling, submission of specimen other than nasopharyngeal swab, presence of viral mutation(s) within the areas targeted by this assay, and inadequate number of viral copies (<250 copies / mL). A negative result must be combined with clinical observations, patient history, and epidemiological information.  Fact Sheet for Patients:   BoilerBrush.com.cy  Fact Sheet for Healthcare Providers: https://pope.com/  This test is not yet approved or  cleared by the [place] FDA and has been authorized for detection and/or diagnosis of SARS-CoV-2 by FDA under an Emergency Use Authorization (EUA).  This EUA will remain in effect (meaning this test can be used) for the duration of the COVID-19 declaration under Section 564(b)(1) of the Act, 21 U.S.C. section 360bbb-3(b)(1), unless the authorization is terminated or revoked sooner.  Performed at [facility], [address] [place], [place] [address]   MRSA PCR Screening     Status: Abnormal   Collection Time: 10/18/19  7:54 PM   Specimen: Nasal Mucosa; Nasopharyngeal  Result Value Ref Range Status   MRSA by PCR POSITIVE (A) NEGATIVE Final    Comment:        The GeneXpert MRSA Assay (FDA approved for NASAL specimens only), is one component of a comprehensive MRSA colonization surveillance program. It is not intended to diagnose MRSA infection nor to guide or monitor treatment for MRSA infections. RESULT CALLED TO, READ BACK BY AND VERIFIED WITH: MICHELE ROGERS 10/18/19 AT 2149 BY ACR Performed at [facility], [address] [place], [place] [address]      Labs: BNP (last 3 results) No results for  input(s): BNP in the last 8760 hours. Basic Metabolic Panel: Recent Labs  Lab 10/17/19 1057 10/18/19 0510 10/19/19 0404 10/20/19 1194  NA 140 140 142 146*  K 4.1 4.9 3.6 4.0  CL 101 104 106 111  CO2 GLUCOSE 127* 114* 100* 114*  BUN 31* 29* 27* 30*  CREATININE 1.78* 1.35* 1.10 1.09  CALCIUM 9.0 8.7* 8.6* 8.6*  MG  --   --  2.1  --    Liver Function Tests: Recent Labs  Lab 10/17/19 1057 10/18/19 0510  AST 32 32  ALT 16 13  ALKPHOS 58 42  BILITOT 0.8 1.1  PROT 8.3* 6.6  ALBUMIN 3.6 2.8*   No results for input(s): LIPASE, AMYLASE in the last 168 hours. Recent Labs  Lab 10/18/19 0942  AMMONIA 12   CBC: Recent Labs  Lab 10/17/19 1057 10/18/19 0510 10/19/19 0404 10/20/19 0617  WBC 14.7* 14.5* 11.2* 9.5  NEUTROABS 12.2*  --   --   --   HGB 13.5 11.0* 10.6* 9.8*  HCT 39.3 33.0* 31.3* 30.4*  MCV 92.5 96.2 93.4 97.7  PLT 249 190 222 240   Cardiac Enzymes: No results for input(s): CKTOTAL, CKMB, CKMBINDEX, TROPONINI in the last 168 hours. BNP: Invalid input(s): POCBNP CBG: No results for input(s): GLUCAP in the last 168 hours. D-Dimer No results for input(s): DDIMER in the last 72 hours. Hgb A1c No results for input(s): HGBA1C in the last 72 hours. Lipid Profile No results for input(s): CHOL, HDL, LDLCALC, TRIG, CHOLHDL, LDLDIRECT in the last 72 hours. Thyroid function studies No results for input(s): TSH, T4TOTAL, T3FREE, THYROIDAB in the last 72 hours.  Invalid input(s): FREET3 Anemia work up No results for input(s): VITAMINB12, FOLATE, FERRITIN, TIBC, IRON, RETICCTPCT in the last 72 hours. Urinalysis    Component Value Date/Time   COLORURINE AMBER (A) 10/17/2019 1110   APPEARANCEUR CLOUDY (A) 10/17/2019 1110   LABSPEC 1.019 10/17/2019 1110   PHURINE 5.0 10/17/2019 1110   GLUCOSEU NEGATIVE 10/17/2019 1110   HGBUR LARGE (A) 10/17/2019 1110   BILIRUBINUR NEGATIVE 10/17/2019 1110   KETONESUR NEGATIVE 10/17/2019 1110   PROTEINUR 100 (A)  10/17/2019 1110   NITRITE NEGATIVE 10/17/2019 1110   LEUKOCYTESUR LARGE (A) 10/17/2019 1110   Sepsis Labs Invalid input(s): PROCALCITONIN,  WBC,  LACTICIDVEN Microbiology Recent Results (from the past 240 hour(s))  Blood culture (routine x 2)     Status: None (Preliminary result)   Collection Time: 10/17/19 10:57 AM   Specimen: BLOOD  Result Value Ref Range Status   Specimen Description BLOOD LEFT FA  Final   Special Requests   Final    BOTTLES DRAWN AEROBIC AND ANAEROBIC Blood Culture results may not be optimal due to an excessive volume of blood received in culture bottles   Culture   Final    NO GROWTH 3 DAYS Performed at [facility], [address], [place], [place] [address]    Report Status PENDING  Incomplete  Blood culture (routine x 2)     Status: None (Preliminary result)   Collection Time: 10/17/19 10:57 AM   Specimen: BLOOD  Result Value Ref Range Status   Specimen Description BLOOD RIGHT [facility]  Final   Special Requests   Final    BOTTLES DRAWN AEROBIC AND ANAEROBIC Blood Culture adequate volume   Culture   Final    NO GROWTH 3 DAYS Performed at [facility], [address] [place], [place] [address]    Report Status PENDING  Incomplete  Urine culture     Status: Abnormal   Collection Time: 10/17/19 11:10 AM   Specimen:  In/Out Cath Urine  Result Value Ref Range Status   Specimen Description   Final    IN/OUT CATH URINE Performed at [facility], [address] [place], [place] [address]    Special Requests   Final    NONE Performed at [facility], [address] [place], [place] [address]    Culture (A)  Final    >=100,000 COLONIES/mL ESCHERICHIA COLI Confirmed Extended Spectrum Beta-Lactamase Producer (ESBL).  In bloodstream infections from ESBL organisms, carbapenems are preferred over piperacillin/tazobactam. They are shown to have a lower risk of mortality.    Report Status 10/19/2019 FINAL  Final   Organism  ID, Bacteria ESCHERICHIA COLI (A)  Final      Susceptibility   Escherichia coli - MIC*    AMPICILLIN >=32 RESISTANT Resistant     CEFAZOLIN >=64 RESISTANT Resistant     CEFTRIAXONE >=64 RESISTANT Resistant     CIPROFLOXACIN >=4 RESISTANT Resistant     GENTAMICIN >=16 RESISTANT Resistant     IMIPENEM <=0.25 SENSITIVE Sensitive     NITROFURANTOIN <=16 SENSITIVE Sensitive     TRIMETH/SULFA >=320 RESISTANT Resistant     AMPICILLIN/SULBACTAM >=32 RESISTANT Resistant     PIP/TAZO 8 SENSITIVE Sensitive     * >=100,000 COLONIES/mL ESCHERICHIA COLI  SARS Coronavirus 2 by RT PCR (hospital order, performed in [facility] hospital lab) Nasopharyngeal Nasopharyngeal Swab     Status: None   Collection Time: 10/17/19  4:09 PM   Specimen: Nasopharyngeal Swab  Result Value Ref Range Status   SARS Coronavirus 2 NEGATIVE NEGATIVE Final    Comment: (NOTE) SARS-CoV-2 target nucleic acids are NOT DETECTED.  The SARS-CoV-2 RNA is generally detectable in upper and lower respiratory specimens during the acute phase of infection. The lowest concentration of SARS-CoV-2 viral copies this assay can detect is 250 copies / mL. A negative result does not preclude SARS-CoV-2 infection and should not be used as the sole basis for treatment or other patient management decisions.  A negative result may occur with improper specimen collection / handling, submission of specimen other than nasopharyngeal swab, presence of viral mutation(s) within the areas targeted by this assay, and inadequate number of viral copies (<250 copies / mL). A negative result must be combined with clinical observations, patient history, and epidemiological information.  Fact Sheet for Patients:   BoilerBrush.com.cyhttps://www.fda.gov/media/136312/download  Fact Sheet for Healthcare Providers: https://pope.com/https://www.fda.gov/media/136313/download  This test is not yet approved or  cleared by the [place] FDA and has been authorized for detection and/or  diagnosis of SARS-CoV-2 by FDA under an Emergency Use Authorization (EUA).  This EUA will remain in effect (meaning this test can be used) for the duration of the COVID-19 declaration under Section 564(b)(1) of the Act, 21 U.S.C. section 360bbb-3(b)(1), unless the authorization is terminated or revoked sooner.  Performed at [facility], [address] [place], [place] [address]   MRSA PCR Screening     Status: Abnormal   Collection Time: 10/18/19  7:54 PM   Specimen: Nasal Mucosa; Nasopharyngeal  Result Value Ref Range Status   MRSA by PCR POSITIVE (A) NEGATIVE Final    Comment:        The GeneXpert MRSA Assay (FDA approved for NASAL specimens only), is one component of a comprehensive MRSA colonization surveillance program. It is not intended to diagnose MRSA infection nor to guide or monitor treatment for MRSA infections. RESULT CALLED TO, READ BACK BY AND VERIFIED WITH: MICHELE ROGERS 10/18/19 AT 2149 BY ACR  Performed at [facility], [address] [place], [place] [address]      Time coordinating discharge: Over 30 minutes  SIGNED:   Pennie Banter, DO [facility] 10/20/2019, 1:06 PM   If 7PM-7AM, please contact night-coverage [email or web address]

## 2019-10-20 NOTE — TOC Transition Note (Signed)
Transition of Care [facility]) - CM/SW Discharge Note   Patient Details  Name: Jeremiah James MRN: [id number] Date of Birth: 1933/02/28  [facility] CM/SW Contact:  Maree Krabbe, LCSW Phone Number: 10/20/2019, 1:27 PM   Clinical Narrative:  Clinical Social Worker facilitated patient discharge including contacting patient family and facility to confirm patient discharge plans.  Clinical information faxed to facility and family agreeable with plan.  CSW arranged ambulance transport via [facility] to [facility] .  RN to call [phone] for report prior to discharge.    Final next level of care: Skilled Nursing Facility Barriers to Discharge: No Barriers Identified   Patient Goals and CMS Choice        Discharge Placement              Patient chooses bed at: [facility] Patient to be transferred to facility by: [facility]   Patient and family notified of of transfer: 10/20/19  Discharge Plan and Services                                     Social Determinants of Health (SDOH) Interventions     Readmission Risk Interventions Readmission Risk Prevention Plan 11/17/2017  Transportation Screening Complete  PCP or Specialist Appt within 5-7 Days Complete  Home Care Screening Complete  Medication Review (RN CM) Complete

## 2019-10-20 NOTE — Progress Notes (Signed)
ANTICOAGULATION CONSULT NOTE - Initial Consult  Pharmacy Consult for Warfarin Indication: atrial fibrillation  Allergies  Allergen Reactions  . Morphine Rash    unknown  . Hydrocodone-Acetaminophen Rash    agitation    Patient Measurements: Height: 5\' 8"  (172.7 cm) Weight: 67.9 kg (149 lb 12.8 oz) IBW/kg (Calculated) : 68.4 Heparin Dosing Weight:    Vital Signs: Temp: 98.6 F (37 C) (09/24 1059) Temp Source: Oral (09/24 1059) BP: 102/56 (09/24 1059) Pulse Rate: 73 (09/24 1059)  Labs: Recent Labs    10/18/19 0510 10/18/19 0510 10/19/19 0404 10/20/19 0617  HGB 11.0*   < > 10.6* 9.8*  HCT 33.0*  --  31.3* 30.4*  PLT 190  --  222 240  LABPROT 27.2*  --  30.6* 26.8*  INR 2.6*  --  3.1* 2.6*  CREATININE 1.35*  --  1.10 1.09   < > = values in this interval not displayed.    Estimated Creatinine Clearance: 48.5 mL/min (by C-G formula based on SCr of 1.09 mg/dL).   Medical History: Past Medical History:  Diagnosis Date  . A-fib (HCC)   . MI (myocardial infarction) (HCC)    x6    Medications:  Scheduled:  . Chlorhexidine Gluconate Cloth  6 each Topical Q0600  . metoprolol succinate  50 mg Oral Daily  . mupirocin ointment  1 application Nasal BID  . tamsulosin  0.4 mg Oral QHS  . Warfarin - Pharmacist Dosing Inpatient   Does not apply q1600   Infusions:  . sodium chloride      Home dose: Warfarin 2.5 mg po at bedtime  Assessment: 86 yo M admitted for AMS with PMH significant for dementia, TIA, CAD s/p DES, combined CHF, and Afib on Warfarin. Patient noted to have blood on tip of penis with small clots present and hematuria. Patient has foley/in-out cath at this time. Pharmacy has been consulted for warfarin dosing and monitoring.   Hgb 13.5>11>10.6>9.8 (baseline ~10); Plt 249>190>222>240  Date INR Dose 9/21 2.4  2.5mg  9/22 2.6 Held 9/23 3.1 Held 9/24 2.6   Goal of Therapy:  INR 2-3 Monitor platelets by anticoagulation protocol: Yes   Plan:  INR  is therapeutic despite holding warfarin x2 days. INR decreased from 3.1>2.6 and will likely decrease again. MD okay to restart warfarin tonight. Will give home dose of 2.5 mg tonight Monitor INR and CBC with AM labs   10/24, PharmD Pharmacy Resident  10/20/2019 11:54 AM

## 2019-10-22 LAB — CULTURE, BLOOD (ROUTINE X 2)
Culture: NO GROWTH
Culture: NO GROWTH
Special Requests: ADEQUATE

## 2020-02-27 DEATH — deceased
